# Patient Record
Sex: Female | Born: 1955 | Race: White | Hispanic: No | Marital: Married
Health system: Southern US, Community
[De-identification: ages and names within clinical notes are randomized; demographics above are authoritative.]

## PROBLEM LIST (undated history)

## (undated) DIAGNOSIS — M51369 Other intervertebral disc degeneration, lumbar region without mention of lumbar back pain or lower extremity pain: Secondary | ICD-10-CM

## (undated) DIAGNOSIS — D499 Neoplasm of unspecified behavior of unspecified site: Secondary | ICD-10-CM

## (undated) DIAGNOSIS — E039 Hypothyroidism, unspecified: Secondary | ICD-10-CM

## (undated) DIAGNOSIS — M5136 Other intervertebral disc degeneration, lumbar region: Secondary | ICD-10-CM

## (undated) DIAGNOSIS — M5030 Other cervical disc degeneration, unspecified cervical region: Secondary | ICD-10-CM

## (undated) HISTORY — DX: Hypothyroidism, unspecified: E03.9

## (undated) HISTORY — DX: Neoplasm of unspecified behavior of unspecified site: D49.9

## (undated) HISTORY — PX: THYROIDECTOMY: SHX17

## (undated) HISTORY — PX: OTHER SURGICAL HISTORY: SHX169

## (undated) HISTORY — DX: Other cervical disc degeneration, unspecified cervical region: M50.30

## (undated) HISTORY — PX: DILATION AND CURETTAGE OF UTERUS: SHX78

## (undated) HISTORY — DX: Other intervertebral disc degeneration, lumbar region without mention of lumbar back pain or lower extremity pain: M51.369

## (undated) HISTORY — DX: Other intervertebral disc degeneration, lumbar region: M51.36

---

## 2004-06-25 ENCOUNTER — Inpatient Hospital Stay (HOSPITAL_COMMUNITY): Admission: RE | Admit: 2004-06-25 | Discharge: 2004-06-27 | Payer: Self-pay | Admitting: Neurosurgery

## 2011-02-01 ENCOUNTER — Emergency Department (HOSPITAL_BASED_OUTPATIENT_CLINIC_OR_DEPARTMENT_OTHER)
Admission: EM | Admit: 2011-02-01 | Discharge: 2011-02-02 | Disposition: A | Discharge status: Home / Self Care | Payer: 59 | Attending: Emergency Medicine | Admitting: Emergency Medicine

## 2011-02-01 DIAGNOSIS — K8020 Calculus of gallbladder without cholecystitis without obstruction: Secondary | ICD-10-CM | POA: Insufficient documentation

## 2011-02-01 DIAGNOSIS — R109 Unspecified abdominal pain: Secondary | ICD-10-CM | POA: Insufficient documentation

## 2011-02-02 ENCOUNTER — Emergency Department (HOSPITAL_COMMUNITY): Payer: Self-pay

## 2011-02-02 ENCOUNTER — Other Ambulatory Visit (HOSPITAL_BASED_OUTPATIENT_CLINIC_OR_DEPARTMENT_OTHER): Payer: Self-pay | Admitting: Emergency Medicine

## 2011-02-02 ENCOUNTER — Emergency Department (HOSPITAL_BASED_OUTPATIENT_CLINIC_OR_DEPARTMENT_OTHER): Payer: 59

## 2011-02-02 DIAGNOSIS — R109 Unspecified abdominal pain: Secondary | ICD-10-CM

## 2011-02-02 LAB — CBC
HCT: 40 % (ref 36.0–46.0)
Hemoglobin: 13.3 g/dL (ref 12.0–15.0)
MCH: 28.1 pg (ref 26.0–34.0)
MCHC: 33.3 g/dL (ref 30.0–36.0)
Platelets: 231 10*3/uL (ref 150–400)
RBC: 4.74 MIL/uL (ref 3.87–5.11)
RDW: 13.3 % (ref 11.5–15.5)
WBC: 5.4 10*3/uL (ref 4.0–10.5)

## 2011-02-02 LAB — DIFFERENTIAL
Basophils Absolute: 0 10*3/uL (ref 0.0–0.1)
Basophils Relative: 0 % (ref 0–1)
Eosinophils Absolute: 0.1 10*3/uL (ref 0.0–0.7)
Eosinophils Relative: 1 % (ref 0–5)
Lymphocytes Relative: 18 % (ref 12–46)
Lymphs Abs: 1 10*3/uL (ref 0.7–4.0)
Monocytes Relative: 9 % (ref 3–12)
Neutro Abs: 3.9 10*3/uL (ref 1.7–7.7)
Neutrophils Relative %: 72 % (ref 43–77)

## 2011-02-02 LAB — COMPREHENSIVE METABOLIC PANEL
ALT: 19 U/L (ref 0–35)
Alkaline Phosphatase: 59 U/L (ref 39–117)
BUN: 15 mg/dL (ref 6–23)
Chloride: 103 mEq/L (ref 96–112)
GFR calc Af Amer: >60 mL/min (ref >60)
GFR calc non Af Amer: >60 mL/min (ref >60)
Potassium: 3.6 mEq/L (ref 3.5–5.1)
Sodium: 140 mEq/L (ref 135–145)
Total Bilirubin: 0.6 mg/dL (ref 0.3–1.2)

## 2011-02-02 LAB — URINALYSIS, ROUTINE W REFLEX MICROSCOPIC
Ketones, ur: NEGATIVE mg/dL
Leukocytes, UA: NEGATIVE
Protein, ur: NEGATIVE mg/dL
Specific Gravity, Urine: 1.026 (ref 1.005–1.030)
Urobilinogen, UA: 0.2 mg/dL (ref 0.0–1.0)
pH: 5.5 (ref 5.0–8.0)

## 2011-02-02 LAB — URINE MICROSCOPIC-ADD ON

## 2011-02-03 ENCOUNTER — Emergency Department (HOSPITAL_BASED_OUTPATIENT_CLINIC_OR_DEPARTMENT_OTHER)
Admission: RE | Admit: 2011-02-03 | Discharge: 2011-02-03 | Disposition: A | Discharge status: Home / Self Care | Payer: 59 | Source: Ambulatory Visit | Attending: Emergency Medicine | Admitting: Emergency Medicine

## 2011-02-03 DIAGNOSIS — K7689 Other specified diseases of liver: Secondary | ICD-10-CM | POA: Insufficient documentation

## 2011-02-03 DIAGNOSIS — K8020 Calculus of gallbladder without cholecystitis without obstruction: Secondary | ICD-10-CM | POA: Insufficient documentation

## 2011-02-03 DIAGNOSIS — N2889 Other specified disorders of kidney and ureter: Secondary | ICD-10-CM | POA: Insufficient documentation

## 2011-02-03 DIAGNOSIS — R109 Unspecified abdominal pain: Secondary | ICD-10-CM

## 2011-02-03 DIAGNOSIS — N200 Calculus of kidney: Secondary | ICD-10-CM

## 2011-02-03 DIAGNOSIS — N201 Calculus of ureter: Secondary | ICD-10-CM

## 2011-08-27 ENCOUNTER — Other Ambulatory Visit: Payer: Self-pay | Admitting: Neurosurgery

## 2011-08-27 DIAGNOSIS — M546 Pain in thoracic spine: Secondary | ICD-10-CM

## 2011-08-30 ENCOUNTER — Ambulatory Visit
Admission: RE | Admit: 2011-08-30 | Discharge: 2011-08-30 | Disposition: A | Discharge status: Home / Self Care | Payer: 59 | Source: Ambulatory Visit | Attending: Neurosurgery | Admitting: Neurosurgery

## 2011-08-30 DIAGNOSIS — M546 Pain in thoracic spine: Secondary | ICD-10-CM

## 2011-08-30 MED ORDER — GADOBENATE DIMEGLUMINE 529 MG/ML IV SOLN
20.0000 mL | Freq: Once | INTRAVENOUS | Status: AC | PRN
  Administered 2011-08-30: 20 mL via INTRAVENOUS

## 2011-09-16 ENCOUNTER — Other Ambulatory Visit: Payer: Self-pay | Admitting: Neurosurgery

## 2011-09-16 DIAGNOSIS — R937 Abnormal findings on diagnostic imaging of other parts of musculoskeletal system: Secondary | ICD-10-CM

## 2011-09-19 ENCOUNTER — Ambulatory Visit
Admission: RE | Admit: 2011-09-19 | Discharge: 2011-09-19 | Disposition: A | Discharge status: Home / Self Care | Payer: 59 | Source: Ambulatory Visit | Attending: Neurosurgery | Admitting: Neurosurgery

## 2011-09-19 DIAGNOSIS — R937 Abnormal findings on diagnostic imaging of other parts of musculoskeletal system: Secondary | ICD-10-CM

## 2013-01-09 IMAGING — CT CT ABD-PELV W/O CM
2 of 4 series · 16 of 46 positions shown, 18 images · non-contrast
Comparison: None.

CLINICAL DATA: Left flank pain

CT ABDOMEN AND PELVIS WITHOUT CONTRAST
TECHNIQUE: Multidetector CT imaging of the abdomen and pelvis was
performed following the standard protocol without intravenous
contrast.

[Series 2: abd/pelvis 5.0 b31f · axial · 0.86mm/px · z∈[-460,-10]mm · 13 of 100 slices shown, 15 images]
[im 5/100  soft-tissue]
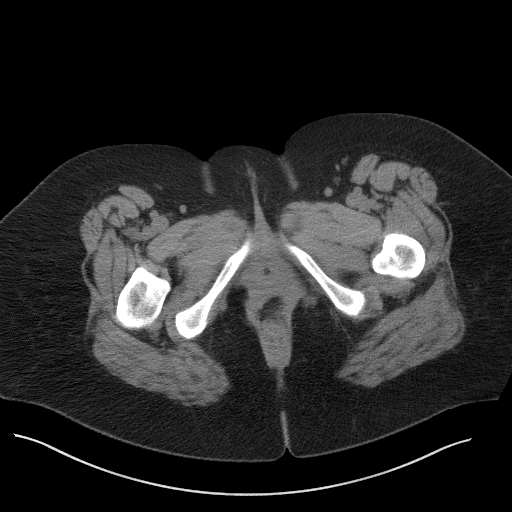
[im 5/100  bone]
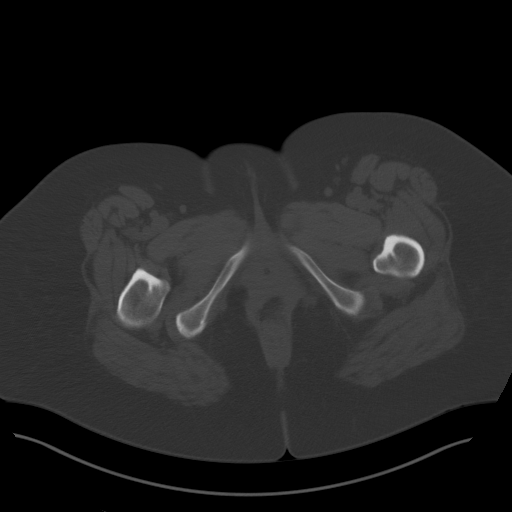
[im 13/100  soft-tissue]
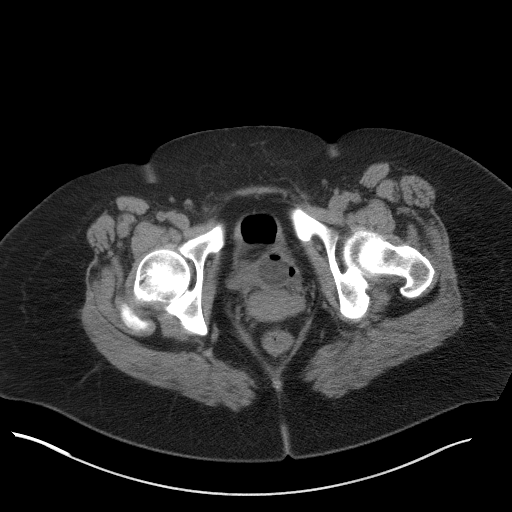
[im 21/100  soft-tissue]
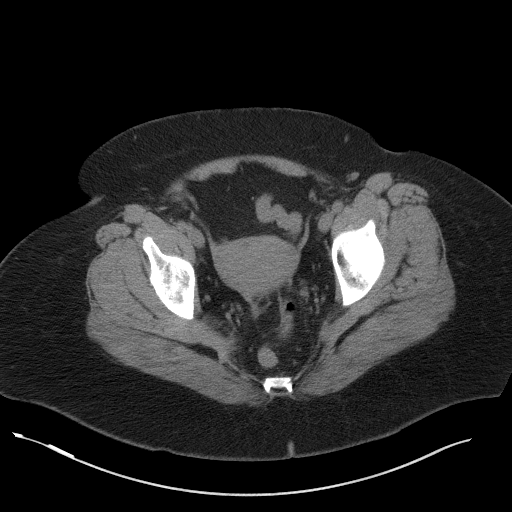
[im 29/100  soft-tissue]
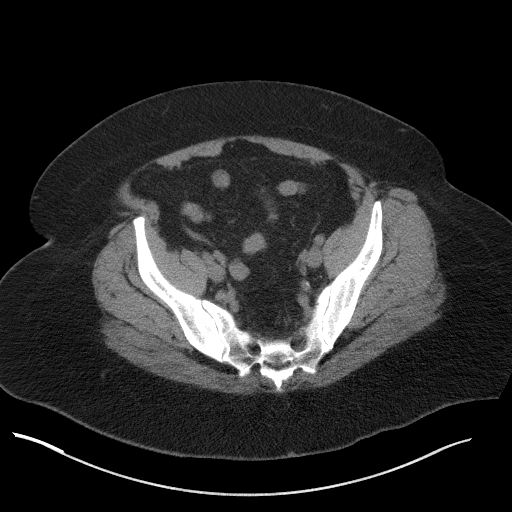
[im 34/100  soft-tissue]
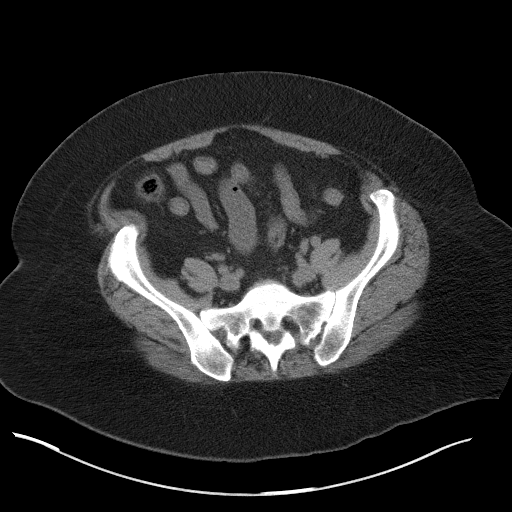
[im 42/100  soft-tissue]
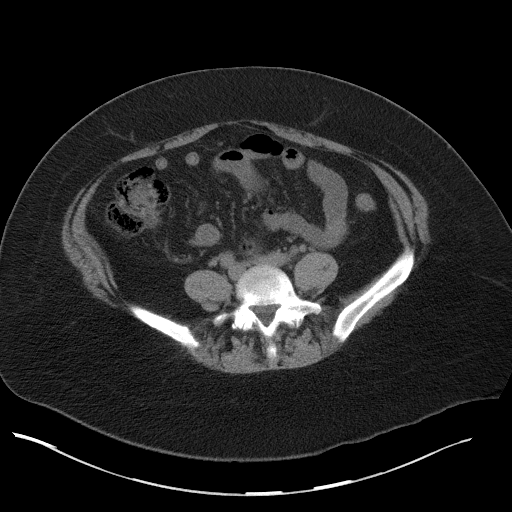
[im 50/100  soft-tissue]
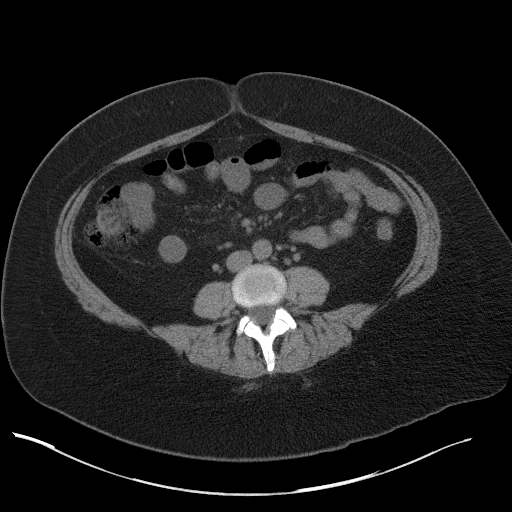
[im 58/100  soft-tissue]
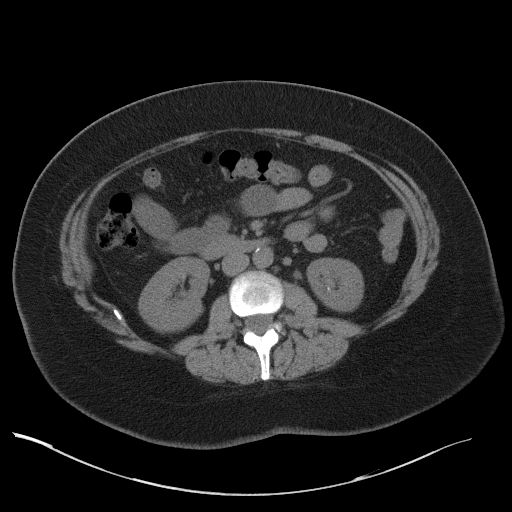
[im 67/100  soft-tissue]
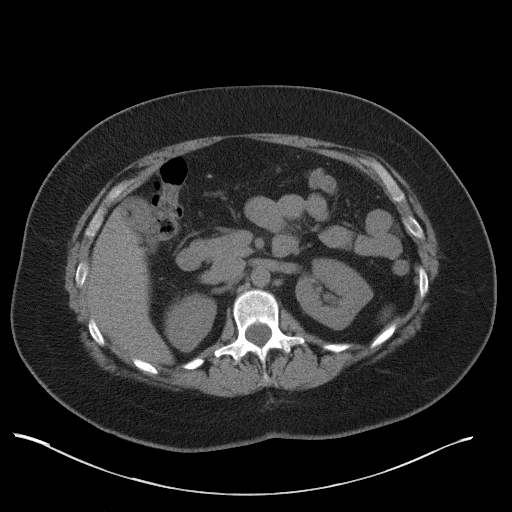
[im 67/100  bone]
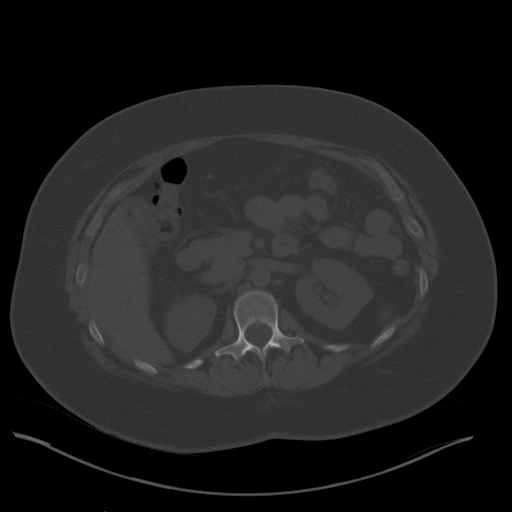
[im 71/100  soft-tissue]
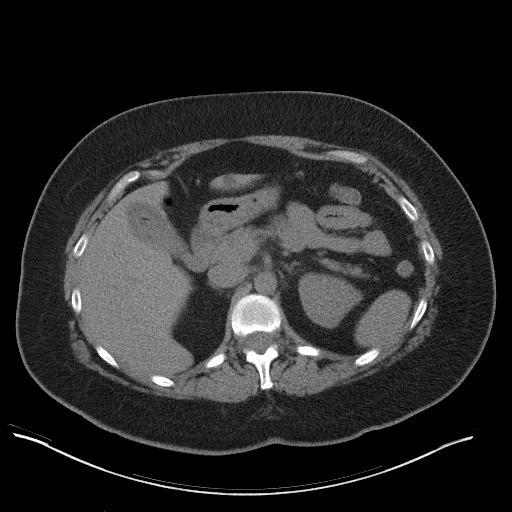
[im 79/100  soft-tissue]
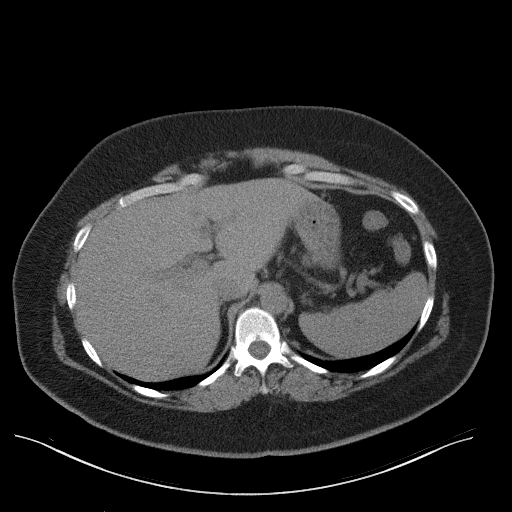
[im 87/100  soft-tissue]
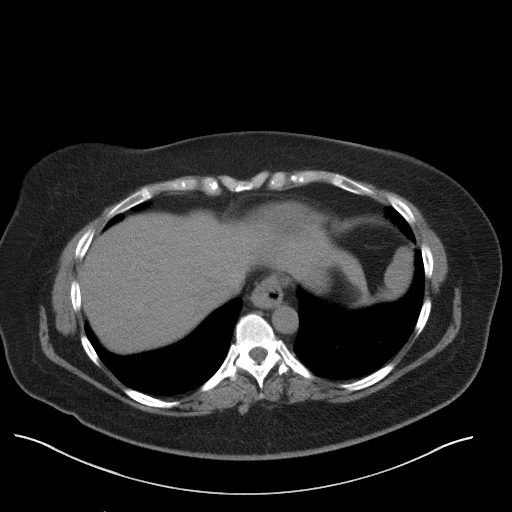
[im 95/100  soft-tissue]
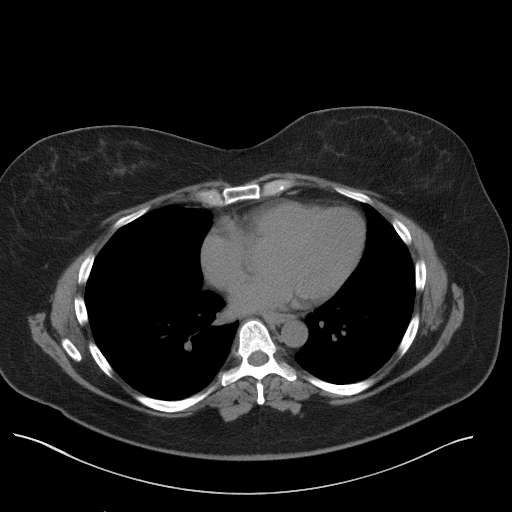

[Series 5: abd/pelvis 3.0 coronal · coronal · 0.87mm/px · 3 of 93 slices shown]
[im 31/93  soft-tissue]
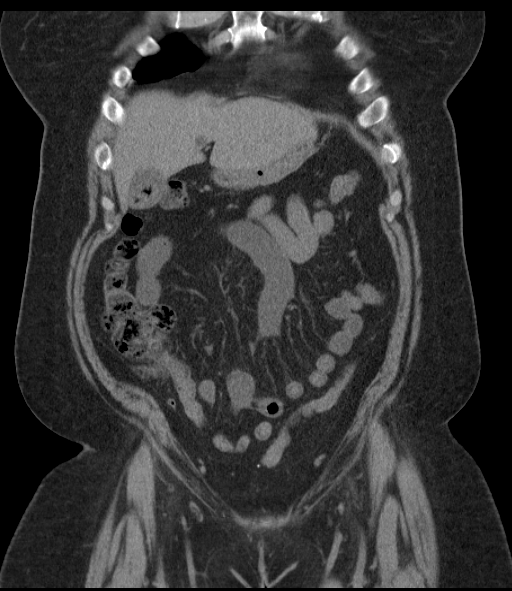
[im 41/93  soft-tissue]
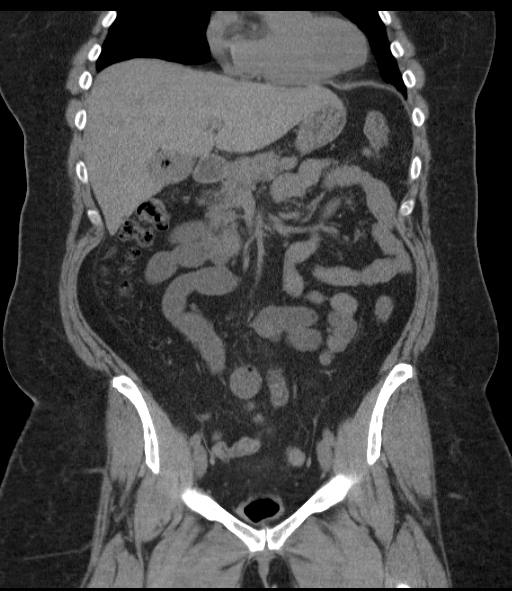
[im 52/93  soft-tissue]
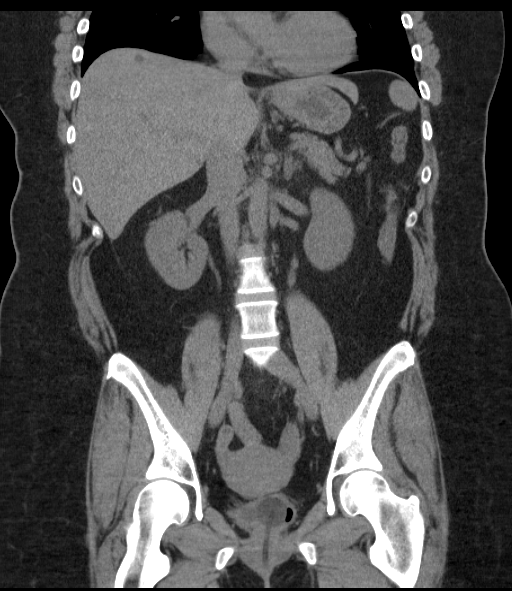

[16 of 46 positions shown; findings below may reference images not displayed]

FINDINGS: There is mild left pyelocaliectasis and ureterectasis
down to the ureterovesicle junction, where there is a tiny 3mm
stone.  There is mild periureteral and pararenal stranding
suggesting mild to moderate obstruction.  There is a small
intrarenal stone in the left kidney measuring less than 2 mm.  The
right renal collecting system is nondilated.  The bladder is
decompressed with Foley catheter and cannot be evaluated.  Small
amount of gas in the bladder, likely from catheterization.

Multiple low attenuation lesions demonstrated in the liver are
nonspecific but probably represent small cysts.  Cholelithiasis
without bile duct dilatation.  The pancreas, spleen, and adrenal
glands are unremarkable.  Small esophageal hiatal hernia.  Normal
caliber large and small bowel.  No abdominal ascites or free air.
Normal caliber abdominal aorta.

Pelvis:  No free or loculated fluid collections.  The uterus and
adnexal structures are not enlarged.  No significant pelvic
lymphadenopathy.  The appendix is normal.  Mild degenerative
changes at the lumbosacral interspace.
IMPRESSION: 3 mm mildly obstructing stone in the left ureterovesicle junction
with mild proximal pyelocaliectasis and ureterectasis.  Small
intrarenal stone on the left.  Low attenuation lesions in the
liver, likely cysts.  Cholelithiasis.

## 2013-08-26 IMAGING — US US SOFT TISSUE HEAD/NECK
1 series · 14 of 25 positions shown · non-contrast
Comparison: Thoracic MRI 08/30/2011.

CLINICAL DATA: 55-year-old female with abnormal thoracic inlet on
MRI.

THYROID ULTRASOUND
TECHNIQUE: Ultrasound examination of the thyroid gland and adjacent
soft tissues was performed.

[Series 1: us soft tissue head/neck · 0.08mm/px · 14 of 46 slices shown]
[im 1/46]
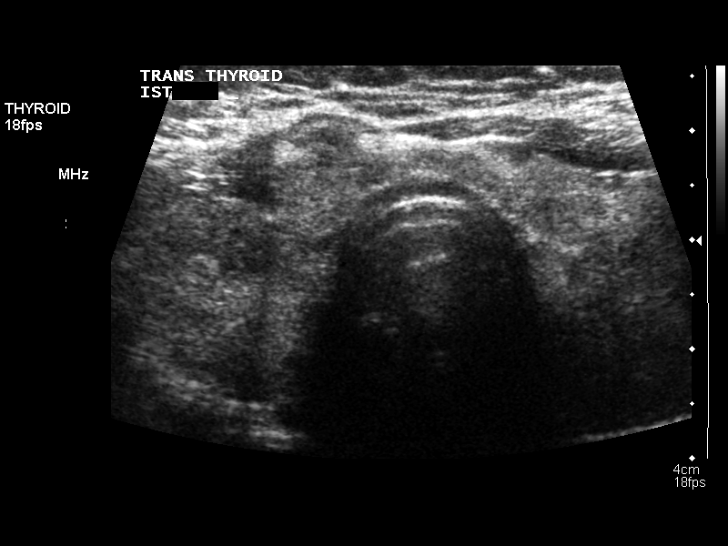
[im 4/46]
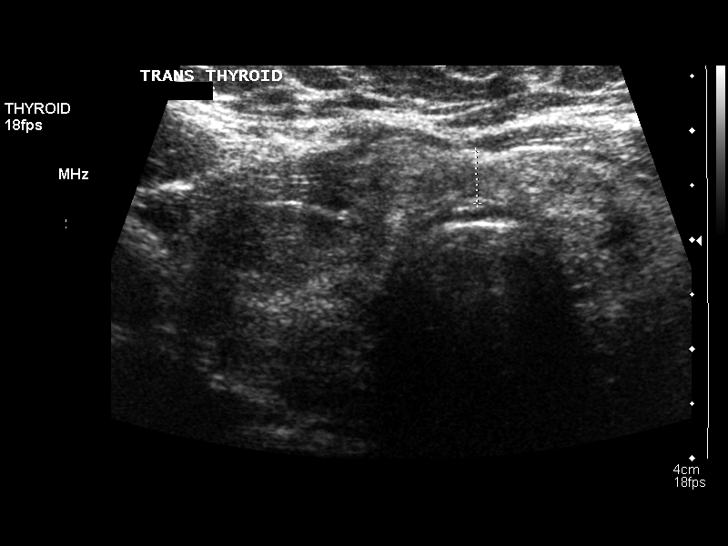
[im 8/46]
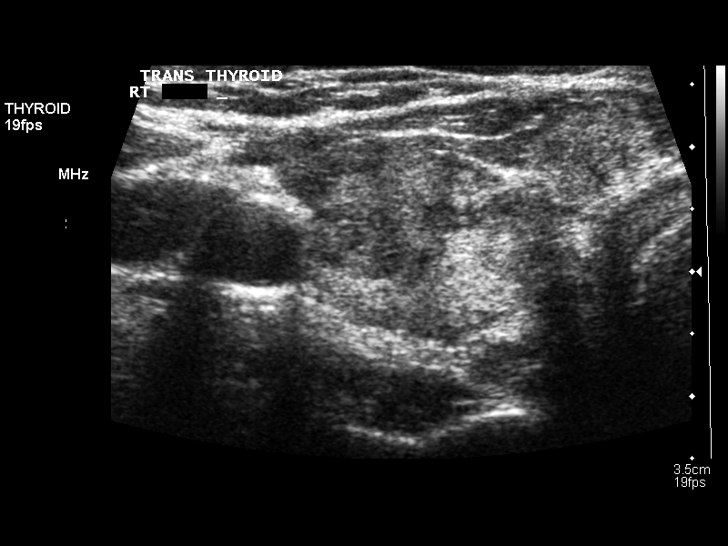
[im 12/46]
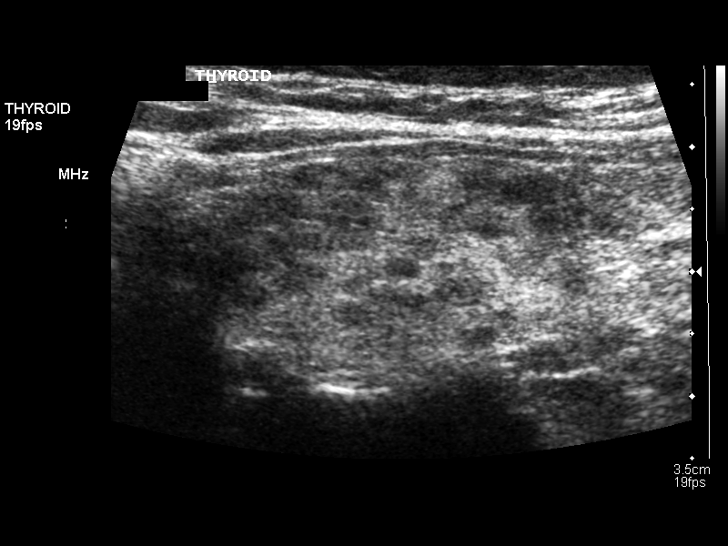
[im 16/46]
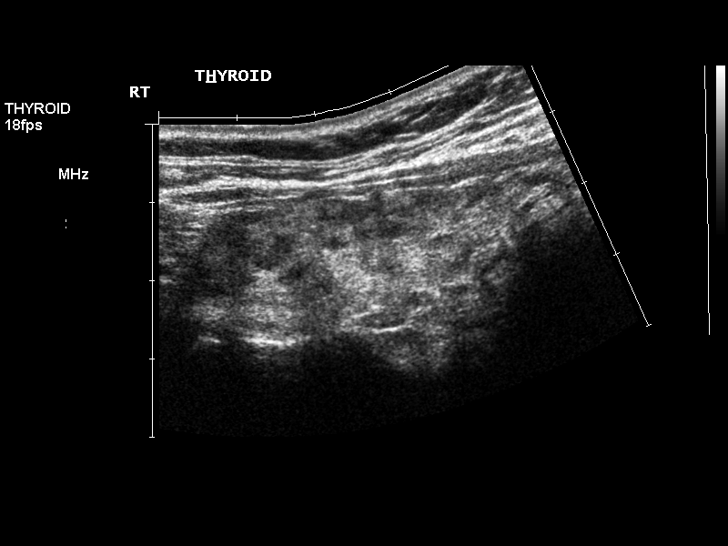
[im 17/46]
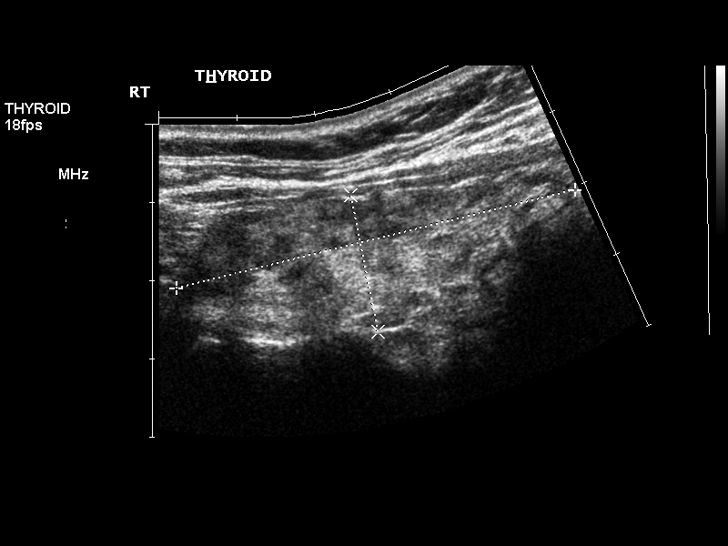
[im 21/46]
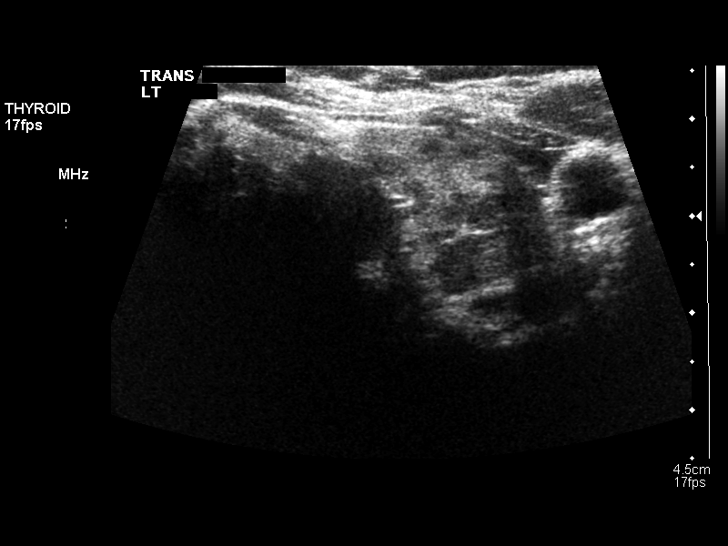
[im 25/46]
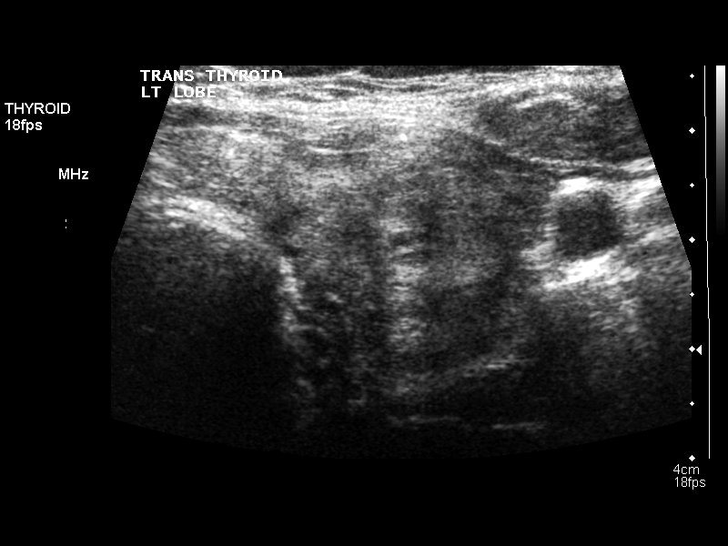
[im 29/46]
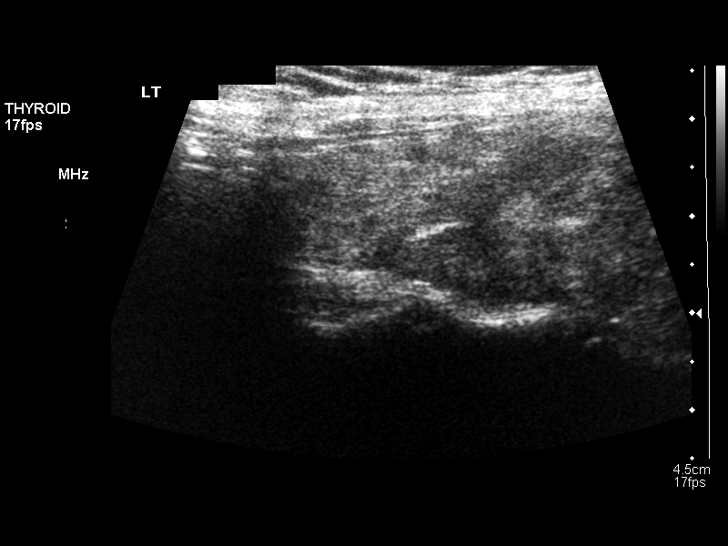
[im 31/46]
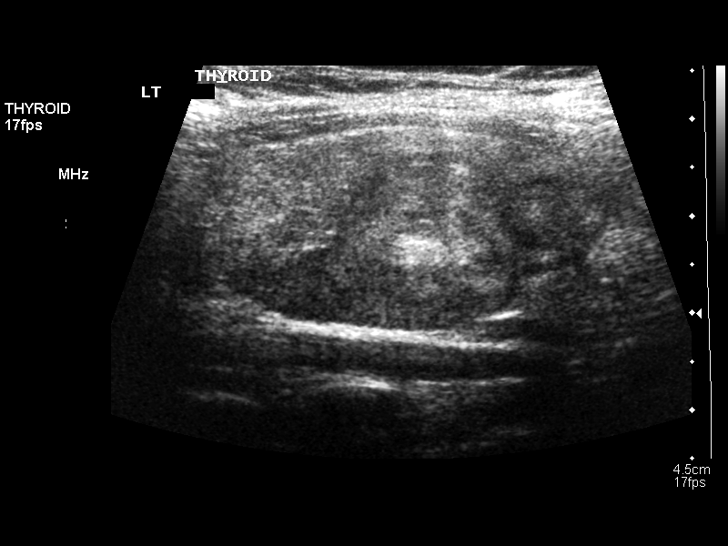
[im 34/46]
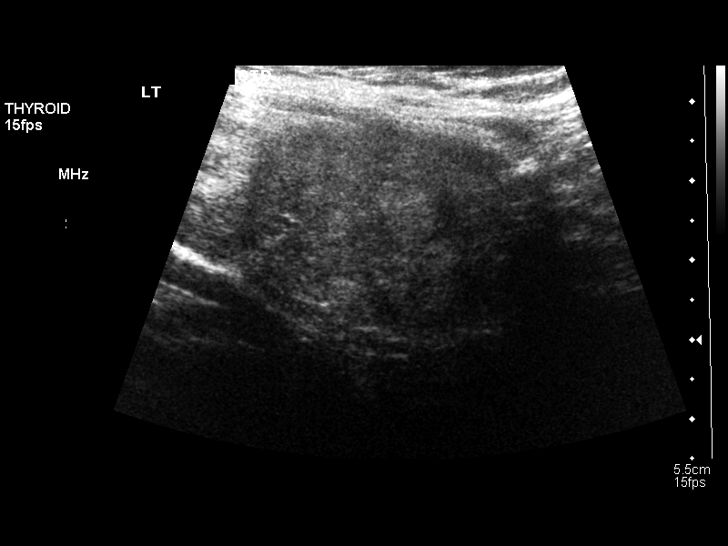
[im 38/46]
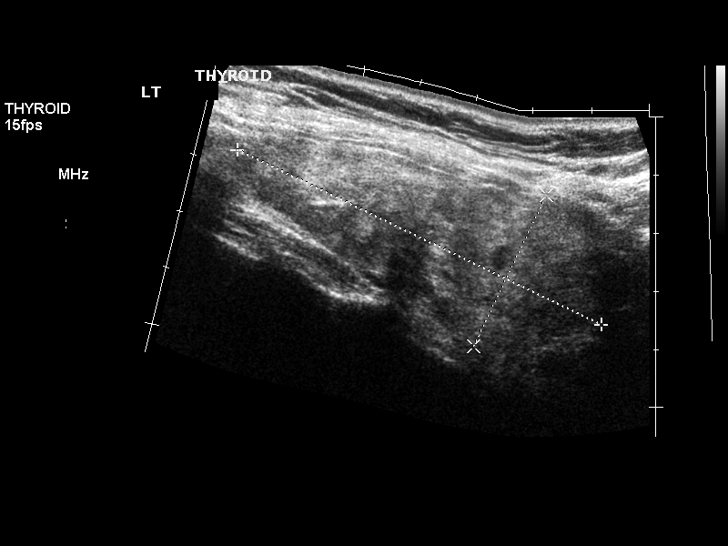
[im 42/46]
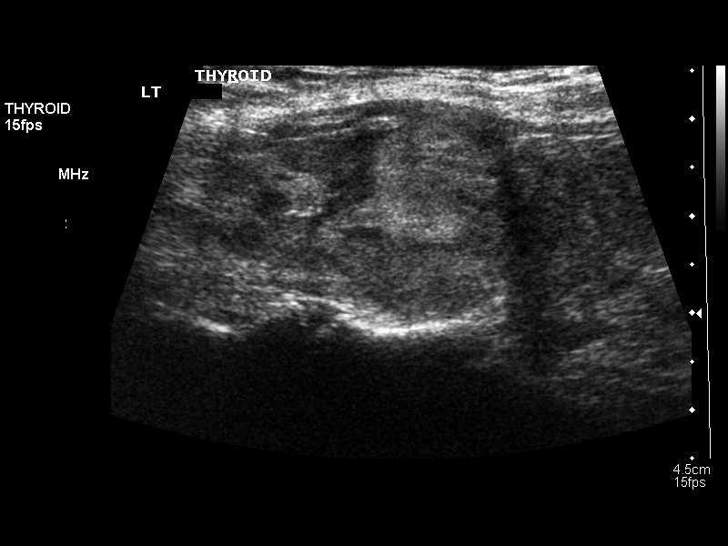
[im 46/46]
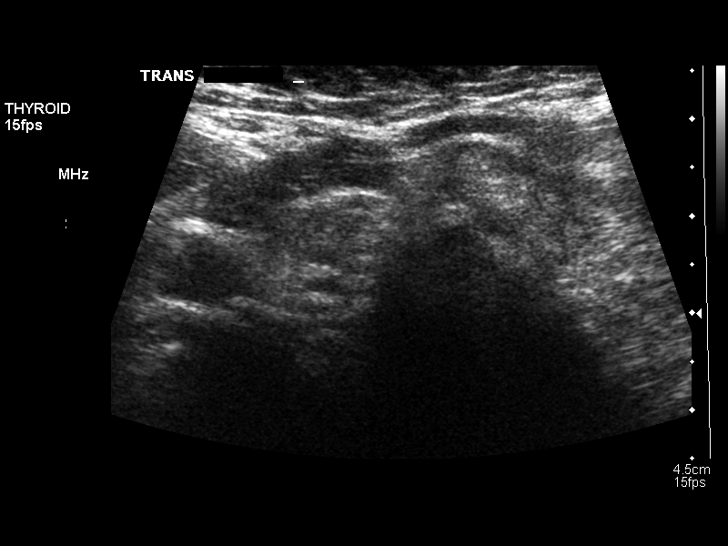

[14 of 25 positions shown; findings below may reference images not displayed]

FINDINGS: Right thyroid lobe:  5.3 x 1.8 x 2.1 cm.
Left thyroid lobe:  7.0 x 2.9 x 3.1 cm.
Isthmus:  5 mm.

Focal nodules:  The thyroid gland is diffusely heterogeneous.

Right lobe:  No discrete nodule.

Left lobe:  Nearly isoechoic nodule versus pseudo-lesion with
central cystic or hypoechoic area measures roughly 3-4 cm.

Lymphadenopathy:  None visualized.
IMPRESSION: Heterogeneous thyroid with left lobe enlargement. Favor benign
goiter and/or sequelae of previous thyroiditis.
Vague, somewhat nodular area in the left lower lobe measures 3-4
cm.
Surveillance thyroid ultrasound in 1 year may be the best option in
this setting.

## 2019-06-11 ENCOUNTER — Other Ambulatory Visit: Payer: Self-pay

## 2019-06-11 DIAGNOSIS — Z20822 Contact with and (suspected) exposure to covid-19: Secondary | ICD-10-CM

## 2019-06-13 LAB — NOVEL CORONAVIRUS, NAA: SARS-CoV-2, NAA: DETECTED — AB

## 2022-10-18 ENCOUNTER — Encounter: Payer: Self-pay | Admitting: Podiatry

## 2022-10-18 ENCOUNTER — Ambulatory Visit (INDEPENDENT_AMBULATORY_CARE_PROVIDER_SITE_OTHER): Payer: 59 | Admitting: Podiatry

## 2022-10-18 DIAGNOSIS — M722 Plantar fascial fibromatosis: Secondary | ICD-10-CM | POA: Diagnosis not present

## 2022-10-18 MED ORDER — DEXAMETHASONE SODIUM PHOSPHATE 120 MG/30ML IJ SOLN
4.0000 mg | Freq: Once | INTRAMUSCULAR | Status: AC
  Administered 2022-10-18: 4 mg via INTRA_ARTICULAR

## 2022-10-18 NOTE — Patient Instructions (Signed)

## 2022-10-18 NOTE — Progress Notes (Signed)
  Subjective:  Patient ID: Kristine Richmond, female    DOB: December 03, 1955,   MRN: [id number]  Chief Complaint  Patient presents with   Foot Pain    left foot has damaged a nerve and having hard time walking without pain    67 y.o. female presents for concern of left foot pain that has been going on for about 4 years. She was told she had damaged a nerve in her foot. Relates she was wearing pad shoes for 7 hours one day and since then has had some trouble with this foot. Relates tingling and burning pain in her feet but this pain is different. . She has tried stretching as she feels it needs to be stretched. She does have issues with her lower back and her left side. She has been seen by neurology but told the pain was due to her weight. Denies any other pedal complaints. Denies n/v/f/c.   History reviewed. No pertinent past medical history.  Objective:  Physical Exam: Vascular: DP/PT pulses 2/4 bilateral. CFT <3 seconds. Normal hair growth on digits. No edema.  Skin. No lacerations or abrasions bilateral feet.  Musculoskeletal: MMT 5/5 bilateral lower extremities in DF, PF, Inversion and Eversion. Deceased ROM in DF of ankle joint. Firm soft tissue mass about 2 cm noted to medial plantar fascia. Mobile with fascia and tender to palpation. No pain to medial calcaneal tubercle.  Neurological: Sensation intact to light touch.   Assessment:   1. Plantar fascial fibromatosis      Plan:  Patient was evaluated and treated and all questions answered. Discussed plantar fibroma with patient.  X-rays deferred today.  Reviewed previous doctors noted.  Discussed treatment options including, ice, NSAIDS, supportive shoes, bracing, and stretching. Stretching exercises provided to be done on a daily basis.   Relates meloxicam not helpful in past.  Verpamil cream prescribed.  Padding provided to offload.  Patient requesting injection today. Procedure note below.   Follow-up 6 weeks or sooner if any  problems arise. In the meantime, encouraged to call the office with any questions, concerns, change in symptoms.   Procedure:  Discussed etiology, pathology, conservative vs. surgical therapies. At this time a plantar fascial injection was recommended.  The patient agreed and a sterile skin prep was applied.  An injection consisting of  dexamethasone and marcaine mixture was infiltrated at the point of maximal tenderness on the left  plantar fibroma.  Bandaid applied. The patient tolerated this well and was given instructions for aftercare.     Lorenda Peck, DPM

## 2022-11-29 ENCOUNTER — Ambulatory Visit (INDEPENDENT_AMBULATORY_CARE_PROVIDER_SITE_OTHER): Payer: 59 | Admitting: Podiatry

## 2022-11-29 ENCOUNTER — Encounter: Payer: Self-pay | Admitting: Podiatry

## 2022-11-29 DIAGNOSIS — M722 Plantar fascial fibromatosis: Secondary | ICD-10-CM | POA: Diagnosis not present

## 2022-11-29 MED ORDER — DEXAMETHASONE SODIUM PHOSPHATE 120 MG/30ML IJ SOLN
4.0000 mg | Freq: Once | INTRAMUSCULAR | Status: AC
  Administered 2022-11-29: 4 mg via INTRA_ARTICULAR

## 2022-11-29 NOTE — Progress Notes (Signed)
  Subjective:  Patient ID: Kristine Richmond, female    DOB: 24-Oct-1955,   MRN: [id number]  No chief complaint on file.   67 y.o. female presents for follow-up of left foot plantar fibroma that has been going on for about 4 years.  Relates she has been wearing braces and that has been helping. She is still getting pain but varies. Has been using night splints that helps and stretching. Has been using cream.  Denies any other pedal complaints. Denies n/v/f/c.   No past medical history on file.  Objective:  Physical Exam: Vascular: DP/PT pulses 2/4 bilateral. CFT <3 seconds. Normal hair growth on digits. No edema.  Skin. No lacerations or abrasions bilateral feet.  Musculoskeletal: MMT 5/5 bilateral lower extremities in DF, PF, Inversion and Eversion. Deceased ROM in DF of ankle joint. Firm soft tissue mass about 2 cm noted to medial plantar fascia. Mobile with fascia and tender to palpation. No pain to medial calcaneal tubercle.  Neurological: Sensation intact to light touch.   Assessment:   1. Plantar fascial fibromatosis       Plan:  Patient was evaluated and treated and all questions answered. Discussed plantar fibroma with patient.  X-rays deferred today.  Reviewed previous doctors noted.  Discussed treatment options including, ice, NSAIDS, supportive shoes, bracing, and stretching. Stretching exercises provided to be done on a daily basis.   Relates meloxicam not helpful in past.  Continue verapamil and night splint.  Patient requesting injection today. Procedure note below.   Follow-up as needed.  Will be going on trip to [place].   Procedure:  Discussed etiology, pathology, conservative vs. surgical therapies. At this time a plantar fascial injection was recommended.  The patient agreed and a sterile skin prep was applied.  An injection consisting of  dexamethasone and marcaine mixture was infiltrated at the point of maximal tenderness on the left  plantar fibroma.  Bandaid  applied. The patient tolerated this well and was given instructions for aftercare.     Lorenda Peck, DPM

## 2023-01-30 ENCOUNTER — Encounter: Payer: Self-pay | Admitting: Podiatry

## 2023-01-30 ENCOUNTER — Ambulatory Visit (INDEPENDENT_AMBULATORY_CARE_PROVIDER_SITE_OTHER): Payer: 59 | Admitting: Podiatry

## 2023-01-30 DIAGNOSIS — M722 Plantar fascial fibromatosis: Secondary | ICD-10-CM | POA: Diagnosis not present

## 2023-01-30 NOTE — Progress Notes (Signed)
  Subjective:  Patient ID: Kristine Richmond, female    DOB: December 21, 1955,   MRN: [id number]  Chief Complaint  Patient presents with   Foot Pain    Patient came in today for left foot heel pain, rate of pain 8 out of 10, patient states that the pain never goes away, only icing has helped     67 y.o. female presents for follow-up of left foot plantar fibroma. Relates the fibroma has improved however she has had more pain in the heel area and relates struggled during her trip. Relates the only thing that has helped in the heel area is icing.   Denies any other pedal complaints. Denies n/v/f/c.   History reviewed. No pertinent past medical history.  Objective:  Physical Exam: Vascular: DP/PT pulses 2/4 bilateral. CFT <3 seconds. Normal hair growth on digits. No edema.  Skin. No lacerations or abrasions bilateral feet.  Musculoskeletal: MMT 5/5 bilateral lower extremities in DF, PF, Inversion and Eversion. Deceased ROM in DF of ankle joint. Firm soft tissue mass in plantar fascia has improved and no longer palpable. Tender today to the medial calcaneal tubercle. No pain in arch. No pain along PT or achilles tendon. No pain with calcaneal squeeze.  Neurological: Sensation intact to light touch.   Assessment:   1. Plantar fasciitis, left        Plan:  Patient was evaluated and treated and all questions answered. Discussed plantar fibroma with patient.  X-rays deferred today.  Reviewed previous doctors noted.  Discussed treatment options including, ice, NSAIDS, supportive shoes, bracing, and stretching. Continue stretching.  Continue night splint.  PF brace dispensed.  Patient requesting injection today. Procedure note below.   Follow-up in 6 weeks for recheck.   Procedure:  Discussed etiology, pathology, conservative vs. surgical therapies. At this time a plantar fascial injection was recommended.  The patient agreed and a sterile skin prep was applied.  An injection consisting of   dexamethasone and marcaine mixture was infiltrated at the point of maximal tenderness on the left  plantar fibroma.  Bandaid applied. The patient tolerated this well and was given instructions for aftercare.     Louann Sjogren, DPM

## 2023-03-17 ENCOUNTER — Encounter (INDEPENDENT_AMBULATORY_CARE_PROVIDER_SITE_OTHER): Payer: 59 | Admitting: Podiatry

## 2023-03-17 DIAGNOSIS — Z91199 Patient's noncompliance with other medical treatment and regimen due to unspecified reason: Secondary | ICD-10-CM

## 2023-03-19 NOTE — Progress Notes (Signed)
This encounter was created in error - please disregard.  Patient was a no show for scheduled appt today. 

## 2023-04-14 ENCOUNTER — Telehealth: Payer: Self-pay | Admitting: Podiatry

## 2023-04-14 NOTE — Telephone Encounter (Signed)
Thanks, we can discuss MRI tomorrow but the timing varies and usually takes a couple weeks to get in at least. Thanks

## 2023-04-14 NOTE — Telephone Encounter (Signed)
Pt scheduled to see you tomorrow in [place] office and wanted to know about getting a mri as the pain is no better since she started coming here. She wanted to know how long it would take to get the mri. She will discuss further at the appt tomorrow.

## 2023-04-15 ENCOUNTER — Ambulatory Visit (INDEPENDENT_AMBULATORY_CARE_PROVIDER_SITE_OTHER): Payer: 59 | Admitting: Podiatry

## 2023-04-15 ENCOUNTER — Encounter: Payer: Self-pay | Admitting: Podiatry

## 2023-04-15 DIAGNOSIS — M722 Plantar fascial fibromatosis: Secondary | ICD-10-CM

## 2023-04-15 MED ORDER — TRIAMCINOLONE ACETONIDE 10 MG/ML IJ SUSP
10.0000 mg | Freq: Once | INTRAMUSCULAR | Status: AC
Start: 2023-04-15 — End: 2023-04-15
  Administered 2023-04-15: 10 mg

## 2023-04-15 MED ORDER — DEXAMETHASONE SODIUM PHOSPHATE 120 MG/30ML IJ SOLN
4.0000 mg | Freq: Once | INTRAMUSCULAR | Status: AC
Start: 2023-04-15 — End: 2023-04-15
  Administered 2023-04-15: 4 mg via INTRA_ARTICULAR

## 2023-04-15 NOTE — Progress Notes (Signed)
  Subjective:  Patient ID: Kristine Richmond, female    DOB: 09-08-1956,   MRN: [id number]  No chief complaint on file.   67 y.o. female presents for follow-up of left foot plantar fasciitis and firboma. Relates the pain is still present and significant. Denies any improvement. Relates injeciton didn't really help for long. States she is stretching and wearing supportive shoes.    Denies any other pedal complaints. Denies n/v/f/c.   No past medical history on file.  Objective:  Physical Exam: Vascular: DP/PT pulses 2/4 bilateral. CFT <3 seconds. Normal hair growth on digits. No edema.  Skin. No lacerations or abrasions bilateral feet.  Musculoskeletal: MMT 5/5 bilateral lower extremities in DF, PF, Inversion and Eversion. Deceased ROM in DF of ankle joint. Firm soft tissue mass in plantar fascia has improved and no longer palpable. Tender today to the medial calcaneal tubercle. No pain in arch. No pain along PT or achilles tendon. No pain with calcaneal squeeze.  Neurological: Sensation intact to light touch.   Assessment:   1. Plantar fasciitis, left   2. Plantar fascial fibromatosis         Plan:  Patient was evaluated and treated and all questions answered. Discussed plantar fibroma with patient.  X-rays deferred today.  Reviewed previous doctors noted.  Discussed treatment options including, ice, NSAIDS, supportive shoes, bracing, and stretching. Continue stretching.  Continue night splint.  Amb ref to PT.  Will try CAM boot to offload for a period of time to see if this calms down pain.  Patient requesting injection today. Procedure note below.   Follow-up in 6 weeks for recheck.   Procedure:  Discussed etiology, pathology, conservative vs. surgical therapies. At this time a plantar fascial injection was recommended.  The patient agreed and a sterile skin prep was applied.  An injection consisting of  dexamethasone and marcaine mixture was infiltrated at the point of maximal  tenderness on the left  plantar fibroma.  Bandaid applied. The patient tolerated this well and was given instructions for aftercare.     Louann Sjogren, DPM

## 2023-05-04 NOTE — Therapy (Signed)
OUTPATIENT PHYSICAL THERAPY LOWER EXTREMITY EVALUATION   Patient Name: Kristine Richmond MRN: [id number] DOB:[birth year], 67 y.o., female Today's Date: 05/05/2023  END OF SESSION:  PT End of Session - 05/05/23 0848     Visit Number 1    Date for PT Re-Evaluation 06/30/23    Authorization Type AET[employer]    PT Start Time 0848    PT Stop Time 0935    PT Time Calculation (min) 47 min    Activity Tolerance Patient tolerated treatment well    Behavior During Therapy [facility] for tasks assessed/performed             History reviewed. No pertinent past medical history. History reviewed. No pertinent surgical history. There are no problems to display for this patient.   PCP: Pcp, No   REFERRING PROVIDER: Louann Sjogren, DPM   REFERRING DIAG: M72.2 (ICD-10-CM) - Plantar fasciitis, left   THERAPY DIAG:  Pain in left foot  Cramp and spasm  Other abnormalities of gait and mobility  Rationale for Evaluation and Treatment: Rehabilitation  ONSET DATE: 7 months ago  SUBJECTIVE:   SUBJECTIVE STATEMENT: Last cortisone shot did help some. It's the best it's been. Had pf 10 years ago. Tried splints this time but had a lot of leg cramps so would kick them off. Back surgery was left side. No feeling in 4th toe from that. 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 She does the stretches but they don't help. Pain in heel right now. Now I don't walk right. Arch supports don't help. Left leg feels week and sometimes shaking.   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 2019 PAIN:  Are you having pain? Yes: NPRS scale: 7 up to 10/10 Pain location: left heel Pain description: stabbing Aggravating factors: walking, prolonged standing Relieving factors: ice or getting off of it, massager  PRECAUTIONS: None  RED FLAGS: None   WEIGHT BEARING RESTRICTIONS: No  FALLS:  Has patient fallen in last 6 months? No  LIVING  ENVIRONMENT: Lives with: lives with their family Lives in: House/apartment Stairs: Yes: Internal: 13 steps; on right going up Has following equipment at home: None  OCCUPATION: travels for work  PLOF: Independent  PATIENT GOALS: walk normal and exercise again   NEXT MD VISIT: none scheduled  OBJECTIVE:   DIAGNOSTIC FINDINGS: none  PATIENT SURVEYS:  LEFS 48 / 80 = 60.0 %  COGNITION: Overall cognitive status: Within functional limits for tasks assessed     SENSATION: Tingling in Left LE from mid thigh down   MUSCLE LENGTH: Marked HS Bil, left soleus  POSTURE:  weight shift forward  PALPATION: Deep palpation to L mid calcaneus insertion of fascia  LUMBAR ROM: WFL    LOWER EXTREMITY ROM:   ROM Right eval Left eval  Ankle dorsiflexion  2/8  Ankle plantarflexion  68  Ankle inversion  26  Ankle eversion  40   (Blank rows = not tested)  Great toe ext: WNL  LOWER EXTREMITY MMT:  MMT Right eval Left eval  Hip flexion 5 5  Hip extension 5 4+  Hip abduction  5  Hip adduction  5  Hip internal rotation    Hip external rotation    Knee flexion 5 5  Knee extension 5 4+ shaky  Ankle dorsiflexion 5 5  Ankle plantarflexion    Ankle inversion 5 4+  Ankle eversion 5 5   (Blank rows = not tested)   GAIT:  Distance walked: 40 Assistive device utilized: None Level of assistance: Complete Independence Comments: ambulates with no heel strike left decreased knee flexion, decreased stance time left   TODAY'S TREATMENT:                                                                                                                              DATE:   05/05/23 See pt ed and HEP   PATIENT EDUCATION:  Education details: PT eval findings, anticipated POC, and HEP update  Person educated: Patient Education method: Explanation, Demonstration, and Handouts Education comprehension: verbalized understanding and returned demonstration  HOME EXERCISE PROGRAM: Access  Code: [email or web address] URL: [email or web address] Date: 05/05/2023 Prepared by: Raynelle Fanning  Exercises - Single Leg Heel Raise  - 1-2 x daily - 7 x weekly - 3 sets - 10 reps - 3 sec hold - Soleus Stretch on Wall  - 2 x daily - 7 x weekly - 1 sets - 3 reps - 30-60 sec hold  ASSESSMENT:  CLINICAL IMPRESSION: Patient is a 67 y.o. female who was seen today for physical therapy evaluation and treatment for left plantar faciitis. She has tightness in her L soleus, gait abnormalities and pain limiting her gait and mobilty. will benefit from skilled PT to address these deficits.    OBJECTIVE IMPAIRMENTS: Abnormal gait, decreased ROM, decreased strength, increased fascial restrictions, increased muscle spasms, impaired flexibility, postural dysfunction, and pain.   ACTIVITY LIMITATIONS: standing and locomotion level  PARTICIPATION LIMITATIONS: occupation  PERSONAL FACTORS: Time since onset of injury/illness/exacerbation are also affecting patient's functional outcome.   REHAB POTENTIAL: Good  CLINICAL DECISION MAKING: Stable/uncomplicated  EVALUATION COMPLEXITY: Low   GOALS: Goals reviewed with patient? Yes  SHORT TERM GOALS: Target date: 06/02/2023  Patient will be independent with initial HEP. Baseline:  Goal status: INITIAL  2.  Decreased pain in L heel by 30% with gait Baseline:  Goal status: INITIAL    LONG TERM GOALS: Target date: 06/30/2023   Patient will be independent with advanced/ongoing HEP to improve outcomes and carryover.  Baseline:  Goal status: INITIAL  2.  Patient will report at least 75% improvement in L foot pain to improve QOL. Baseline:  Goal status: INITIAL  3.  Patient will demonstrate improved L ankle active DF to  5 deg  to allow for normal gait and stair mechanics. Baseline:  Goal status: INITIAL  4.  Patient will be able to ambulate 600' a normal gait pattern without increased foot pain to access community.  Baseline:  Goal status:  INITIAL  5. Patient will report 57/80 on LEFS  to demonstrate improved functional ability. Baseline: 48 / 80 = 60.0 % Goal status: INITIAL      PLAN:  PT FREQUENCY: 1-2x/week  PT DURATION: 8 weeks  PLANNED INTERVENTIONS: Therapeutic exercises, Therapeutic activity, Neuromuscular re-education, Balance training, Gait training, Patient/Family education, Self Care, Joint mobilization, Stair training, Dry Needling, Electrical stimulation, Spinal mobilization, Cryotherapy, Moist heat, Taping, Ultrasound, Ionotophoresis 4mg /ml  Dexamethasone, and Manual therapy  PLAN FOR NEXT SESSION:MT/DN to quadratus plantae and gastroc/solues; review tempo heel lifts, [name] and/or ionto, gait training   Solon Palm, PT  05/05/2023, 1:02 PM

## 2023-05-05 ENCOUNTER — Ambulatory Visit: Payer: 59 | Attending: Podiatry | Admitting: Physical Therapy

## 2023-05-05 ENCOUNTER — Encounter: Payer: Self-pay | Admitting: Physical Therapy

## 2023-05-05 ENCOUNTER — Other Ambulatory Visit: Payer: Self-pay

## 2023-05-05 DIAGNOSIS — M722 Plantar fascial fibromatosis: Secondary | ICD-10-CM | POA: Insufficient documentation

## 2023-05-05 DIAGNOSIS — M79672 Pain in left foot: Secondary | ICD-10-CM | POA: Diagnosis present

## 2023-05-05 DIAGNOSIS — R252 Cramp and spasm: Secondary | ICD-10-CM | POA: Insufficient documentation

## 2023-05-05 DIAGNOSIS — R2689 Other abnormalities of gait and mobility: Secondary | ICD-10-CM | POA: Insufficient documentation

## 2023-05-11 NOTE — Therapy (Signed)
OUTPATIENT PHYSICAL THERAPY LOWER EXTREMITY TREATMENT   Patient Name: Kristine Richmond MRN: [id number] DOB:March 03, 1956, 67 y.o., female Today's Date: 05/12/2023  END OF SESSION:  PT End of Session - 05/12/23 0843     Visit Number 2    Date for PT Re-Evaluation 06/30/23    Authorization Type AETNA    PT Start Time 0800    PT Stop Time 0843    PT Time Calculation (min) 43 min    Activity Tolerance Patient tolerated treatment well    Behavior During Therapy [facility] for tasks assessed/performed              History reviewed. No pertinent past medical history. History reviewed. No pertinent surgical history. There are no problems to display for this patient.   PCP: Pcp, No   REFERRING PROVIDER: Louann Sjogren, DPM   REFERRING DIAG: M72.2 (ICD-10-CM) - Plantar fasciitis, left   THERAPY DIAG:  Pain in left foot  Cramp and spasm  Other abnormalities of gait and mobility  Rationale for Evaluation and Treatment: Rehabilitation  ONSET DATE: 7 months ago  SUBJECTIVE:   SUBJECTIVE STATEMENT: Walked around on Saturday taking engagement pictures with her son and Sunday she could not even get out of bed Sunday.    Eval: Last cortisone shot did help some. It's the best it's been. Had pf 10 years ago. Tried splints this time but had a lot of leg cramps so would kick them off. Back surgery was left side. No feeling in 4th toe from that. 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 She does the stretches but they don't help. Pain in heel right now. Now I don't walk right. Arch supports don't help. Left leg feels week and sometimes shaking.   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 2019 PAIN:  Are you having pain? Yes: NPRS scale: 5 up to 10/10 Pain location: left heel Pain description: stabbing Aggravating factors: walking, prolonged standing Relieving factors: ice or getting off of it,  massager  PRECAUTIONS: None  RED FLAGS: None   WEIGHT BEARING RESTRICTIONS: No  FALLS:  Has patient fallen in last 6 months? No  LIVING ENVIRONMENT: Lives with: lives with their family Lives in: House/apartment Stairs: Yes: Internal: 13 steps; on right going up Has following equipment at home: None  OCCUPATION: travels for work  PLOF: Independent  PATIENT GOALS: walk normal and exercise again   NEXT MD VISIT: none scheduled  OBJECTIVE:   DIAGNOSTIC FINDINGS: none  PATIENT SURVEYS:  LEFS 48 / 80 = 60.0 %  COGNITION: Overall cognitive status: Within functional limits for tasks assessed     SENSATION: Tingling in Left LE from mid thigh down   MUSCLE LENGTH: Marked HS Bil, left soleus  POSTURE:  weight shift forward  PALPATION: Deep palpation to L mid calcaneus insertion of fascia  LUMBAR ROM: WFL    LOWER EXTREMITY ROM:   ROM Right eval Left eval  Ankle dorsiflexion  2/8  Ankle plantarflexion  68  Ankle inversion  26  Ankle eversion  40   (Blank rows = not tested)  Great toe ext: WNL  LOWER EXTREMITY MMT:  MMT Right eval Left eval  Hip flexion 5 5  Hip extension 5 4+  Hip abduction  5  Hip adduction  5  Hip internal rotation    Hip external rotation    Knee flexion 5 5  Knee extension 5 4+ shaky  Ankle dorsiflexion 5  5  Ankle plantarflexion    Ankle inversion 5 4+  Ankle eversion 5 5   (Blank rows = not tested)   GAIT: Distance walked: 40 Assistive device utilized: None Level of assistance: Complete Independence Comments: ambulates with no heel strike left decreased knee flexion, decreased stance time left   TODAY'S TREATMENT:                                                                                                                              DATE:    05/12/23 Bike L 2 x 5.5 min Bil 3 sec tempo heel lifts x 10. Then L only x 10 (no pain) 1.5 min rest, L x 10 Seated fig 4 stretch bil x 20 sec Manual: Skilled  palpation and monitoring of soft tissues during DN, STM to L plantar fascia and L gastroc/soleus Trigger Point Dry-Needling  Treatment instructions: Expect mild to moderate muscle soreness. S/S of pneumothorax if dry needled over a lung field, and to seek immediate medical attention should they occur. Patient verbalized understanding of these instructions and education. Patient Consent Given: Yes Education handout provided: Previously provided Muscles treated: L quadratus plantae, lateral gastroc Electrical stimulation performed: No Parameters: N/A Treatment response/outcome: Twitch Response Elicited and Palpable Increase in Muscle Length    05/05/23 See pt ed and HEP   PATIENT EDUCATION:  Education details: PT eval findings, anticipated POC, and HEP update  Person educated: Patient Education method: Explanation, Demonstration, and Handouts Education comprehension: verbalized understanding and returned demonstration  HOME EXERCISE PROGRAM: Access Code: [email or web address] URL: [email or web address] Date: 05/12/2023 Prepared by: Raynelle Fanning  Exercises - Single Leg Heel Raise  - 1-2 x daily - 7 x weekly - 3 sets - 10 reps - 3 sec hold - Soleus Stretch on Wall  - 2 x daily - 7 x weekly - 1 sets - 3 reps - 30-60 sec hold - Seated Piriformis Stretch with Trunk Bend  - 2 x daily - 7 x weekly - 1 sets - 3 reps - 30-60 sec  hold - Standing Plantar Fascia Mobilization with Small Ball  - 1 x daily - 3-4 x weekly - 1 sets - 10 reps  ASSESSMENT:  CLINICAL IMPRESSION: AMERIA POSTLETHWAIT presents reporting increased pain on Sunday to where she couldn't get out of bed. Today was better, but she needs cueing to use correct heel strike with gait. She had increased pain with single leg heel lift, so we reverted to B heel raises. Once this was completed she was able to tolerate Left with 3 sec tempo. Initial trial of DN/MTwith excellent twitch responses. Advised use of tennis ball at home once per day. We  also discussed seeing a neurologist based on her symptoms which include tremors in the L UE and overheating and sweating with minimal exercise as well as abnormal gait pattern. Brynnlie continues to demonstrate potential for improvement and would benefit from continued skilled therapy to address impairments.  OBJECTIVE IMPAIRMENTS: Abnormal gait, decreased ROM, decreased strength, increased fascial restrictions, increased muscle spasms, impaired flexibility, postural dysfunction, and pain.   ACTIVITY LIMITATIONS: standing and locomotion level  PARTICIPATION LIMITATIONS: occupation  PERSONAL FACTORS: Time since onset of injury/illness/exacerbation are also affecting patient's functional outcome.   REHAB POTENTIAL: Good  CLINICAL DECISION MAKING: Stable/uncomplicated  EVALUATION COMPLEXITY: Low   GOALS: Goals reviewed with patient? Yes  SHORT TERM GOALS: Target date: 06/02/2023  Patient will be independent with initial HEP. Baseline:  Goal status: INITIAL  2.  Decreased pain in L heel by 30% with gait Baseline:  Goal status: INITIAL    LONG TERM GOALS: Target date: 06/30/2023   Patient will be independent with advanced/ongoing HEP to improve outcomes and carryover.  Baseline:  Goal status: INITIAL  2.  Patient will report at least 75% improvement in L foot pain to improve QOL. Baseline:  Goal status: INITIAL  3.  Patient will demonstrate improved L ankle active DF to  5 deg  to allow for normal gait and stair mechanics. Baseline:  Goal status: INITIAL  4.  Patient will be able to ambulate 600' a normal gait pattern without increased foot pain to access community.  Baseline:  Goal status: INITIAL  5. Patient will report 57/80 on LEFS  to demonstrate improved functional ability. Baseline: 48 / 80 = 60.0 % Goal status: INITIAL      PLAN:  PT FREQUENCY: 1-2x/week  PT DURATION: 8 weeks  PLANNED INTERVENTIONS: Therapeutic exercises, Therapeutic activity,  Neuromuscular re-education, Balance training, Gait training, Patient/Family education, Self Care, Joint mobilization, Stair training, Dry Needling, Electrical stimulation, Spinal mobilization, Cryotherapy, Moist heat, Taping, Ultrasound, Ionotophoresis 4mg /ml Dexamethasone, and Manual therapy  PLAN FOR NEXT SESSION:Assess MT/DN to quadratus plantae and gastroc/solues; review tempo heel lifts, [name] and/or ionto, gait training   Solon Palm, PT  05/12/2023, 5:25 PM

## 2023-05-12 ENCOUNTER — Encounter: Payer: Self-pay | Admitting: Physical Therapy

## 2023-05-12 ENCOUNTER — Ambulatory Visit: Payer: 59 | Admitting: Physical Therapy

## 2023-05-12 DIAGNOSIS — M79672 Pain in left foot: Secondary | ICD-10-CM

## 2023-05-12 DIAGNOSIS — R2689 Other abnormalities of gait and mobility: Secondary | ICD-10-CM

## 2023-05-12 DIAGNOSIS — R252 Cramp and spasm: Secondary | ICD-10-CM

## 2023-05-27 ENCOUNTER — Ambulatory Visit: Payer: 59

## 2023-05-27 ENCOUNTER — Other Ambulatory Visit: Payer: Self-pay | Admitting: Podiatry

## 2023-05-27 ENCOUNTER — Other Ambulatory Visit: Payer: Self-pay

## 2023-05-27 ENCOUNTER — Telehealth: Payer: Self-pay | Admitting: Podiatry

## 2023-05-27 DIAGNOSIS — M79672 Pain in left foot: Secondary | ICD-10-CM

## 2023-05-27 DIAGNOSIS — M722 Plantar fascial fibromatosis: Secondary | ICD-10-CM

## 2023-05-27 DIAGNOSIS — R29898 Other symptoms and signs involving the musculoskeletal system: Secondary | ICD-10-CM

## 2023-05-27 DIAGNOSIS — R2689 Other abnormalities of gait and mobility: Secondary | ICD-10-CM

## 2023-05-27 DIAGNOSIS — R252 Cramp and spasm: Secondary | ICD-10-CM

## 2023-05-27 NOTE — Telephone Encounter (Signed)
Notified pt referral was being sent  and she said thank you

## 2023-05-27 NOTE — Therapy (Signed)
OUTPATIENT PHYSICAL THERAPY LOWER EXTREMITY TREATMENT   Patient Name: Kristine Richmond MRN: [id number] DOB:[birth year], 67 y.o., female Today's Date: 05/27/2023  END OF SESSION:  PT End of Session - 05/27/23 1021     Visit Number 3    Date for PT Re-Evaluation 06/30/23    Authorization Type AETNA    PT Start Time 0802    PT Stop Time 0845    PT Time Calculation (min) 43 min               History reviewed. No pertinent past medical history. History reviewed. No pertinent surgical history. There are no problems to display for this patient.   PCP: Pcp, No   REFERRING PROVIDER: Louann Sjogren, DPM   REFERRING DIAG: M72.2 (ICD-10-CM) - Plantar fasciitis, left   THERAPY DIAG:  Pain in left foot  Cramp and spasm  Other abnormalities of gait and mobility  Rationale for Evaluation and Treatment: Rehabilitation  ONSET DATE: 7 months ago  SUBJECTIVE:   SUBJECTIVE STATEMENT: 05/27/23:  still very painful L foot, if I walk around any I am in so much pain the next day I need help to get out of bed.  Sometimes the pain wakes me in the middle of the night.  Eval: Last cortisone shot did help some. It's the best it's been. Had pf 10 years ago. Tried splints this time but had a lot of leg cramps so would kick them off. Back surgery was left side. No feeling in 4th toe from that. 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 She does the stretches but they don't help. Pain in heel right now. Now I don't walk right. Arch supports don't help. Left leg feels week and sometimes shaking.   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 2019 PAIN:  Are you having pain? Yes: NPRS scale: 5 up to 10/10 Pain location: left heel Pain description: stabbing Aggravating factors: walking, prolonged standing Relieving factors: ice or getting off of it, massager  PRECAUTIONS: None  RED FLAGS: None   WEIGHT BEARING  RESTRICTIONS: No  FALLS:  Has patient fallen in last 6 months? No  LIVING ENVIRONMENT: Lives with: lives with their family Lives in: House/apartment Stairs: Yes: Internal: 13 steps; on right going up Has following equipment at home: None  OCCUPATION: travels for work  PLOF: Independent  PATIENT GOALS: walk normal and exercise again   NEXT MD VISIT: none scheduled  OBJECTIVE:   DIAGNOSTIC FINDINGS: none  PATIENT SURVEYS:  LEFS 48 / 80 = 60.0 %  COGNITION: Overall cognitive status: Within functional limits for tasks assessed     SENSATION: Tingling in Left LE from mid thigh down   MUSCLE LENGTH: Marked HS Bil, left soleus  POSTURE:  weight shift forward  PALPATION: Deep palpation to L mid calcaneus insertion of fascia  LUMBAR ROM: WFL    LOWER EXTREMITY ROM:   ROM Right eval Left eval   Ankle dorsiflexion  2/8   Ankle plantarflexion  68   Ankle inversion  26   Ankle eversion  40    (Blank rows = not tested)  Great toe ext: WNL  LOWER EXTREMITY MMT:  MMT Right eval Left eval  Hip flexion 5 5  Hip extension 5 4+  Hip abduction  5  Hip adduction  5  Hip internal rotation    Hip external rotation    Knee flexion 5 5  Knee extension 5 4+  shaky  Ankle dorsiflexion 5 5  Ankle plantarflexion    Ankle inversion 5 4+  Ankle eversion 5 5   (Blank rows = not tested)   GAIT: Distance walked: 40 Assistive device utilized: None Level of assistance: Complete Independence Comments: ambulates with no heel strike left decreased knee flexion, decreased stance time left   TODAY'S TREATMENT:                                                                                                                              DATE:   05/27/23: measured L ankle dorsiflexion, AAROM 6 degrees compared to R over 22 degrees Manual:  supine for mobilization with movement for L ankle dorsiflexion, with AP glides L subtalar jt, gr 3  Prone for myofascial release med and  lat gastrocs and L soleus, pt with clonus reaction with this technique x 2  Standing for lunges L ankle with subtalar mobs, mob with movement for AP glides subtalar jt, 2 bouts 15 reps  Therex: Attempted stretch of L plantarflexors in standing with L forefoot on 2" book, manual cues to maintain L heel on ground and keep L knee extended, patient without discernable stretch L plantarflexors, changed to : Standing stretch L plantarflexors, with washcloth rolled under medial 3 MT heads, to control overpronation, patient noted stretch L plantarflexors, so advised to utilize this stretch now for a trial   05/12/23 Bike L 2 x 5.5 min Bil 3 sec tempo heel lifts x 10. Then L only x 10 (no pain) 1.5 min rest, L x 10 Seated fig 4 stretch bil x 20 sec Manual: Skilled palpation and monitoring of soft tissues during DN, STM to L plantar fascia and L gastroc/soleus Trigger Point Dry-Needling  Treatment instructions: Expect mild to moderate muscle soreness. S/S of pneumothorax if dry needled over a lung field, and to seek immediate medical attention should they occur. Patient verbalized understanding of these instructions and education. Patient Consent Given: Yes Education handout provided: Previously provided Muscles treated: L quadratus plantae, lateral gastroc Electrical stimulation performed: No Parameters: N/A Treatment response/outcome: Twitch Response Elicited and Palpable Increase in Muscle Length    05/05/23 See pt ed and HEP   PATIENT EDUCATION:  Education details: PT eval findings, anticipated POC, and HEP update  Person educated: Patient Education method: Explanation, Demonstration, and Handouts Education comprehension: verbalized understanding and returned demonstration  HOME EXERCISE PROGRAM: Access Code: [email or web address] URL: [email or web address] Date: 05/12/2023 Prepared by: Raynelle Fanning  Exercises - Single Leg Heel Raise  - 1-2 x daily - 7 x weekly - 3 sets - 10 reps - 3 sec  hold - Soleus Stretch on Wall  - 2 x daily - 7 x weekly - 1 sets - 3 reps - 30-60 sec hold - Seated Piriformis Stretch with Trunk Bend  - 2 x daily - 7 x weekly - 1 sets - 3 reps - 30-60 sec  hold - Standing Plantar Fascia Mobilization with Small  Ball  - 1 x daily - 3-4 x weekly - 1 sets - 10 reps  ASSESSMENT:  CLINICAL IMPRESSION: Kristine Richmond presents reporting no real change in her pain L heel with current stretching and pain management techniques with skilled PT.   Added mid foot and subtalar jt mobilizations to today's skilled treatment techniques, also changed her position/ technique for stretching her L ankle plantarflexors.  Concerning today was several episodes of involuntary inversion of L foot,with position changes, also + Babinski on L and clonus activity with myofascial release/deep massage L medial plantarflexors.  Combined with tremor L arm and L Leg and no arm swing, again reinforced to the patient  seeing a neurologist. She did report improved Sx following today's techniques, however she has ? tone influencing the resting position of L foot/ankle which may be contributing to her pain.  Leianne continues to demonstrate potential for improvement and would benefit from continued skilled therapy to address impairments.     OBJECTIVE IMPAIRMENTS: Abnormal gait, decreased ROM, decreased strength, increased fascial restrictions, increased muscle spasms, impaired flexibility, postural dysfunction, and pain.   ACTIVITY LIMITATIONS: standing and locomotion level  PARTICIPATION LIMITATIONS: occupation  PERSONAL FACTORS: Time since onset of injury/illness/exacerbation are also affecting patient's functional outcome.   REHAB POTENTIAL: Good  CLINICAL DECISION MAKING: Stable/uncomplicated  EVALUATION COMPLEXITY: Low   GOALS: Goals reviewed with patient? Yes  SHORT TERM GOALS: Target date: 06/02/2023  Patient will be independent with initial HEP. Baseline:  Goal status: INITIAL  2.   Decreased pain in L heel by 30% with gait Baseline:  Goal status: INITIAL    LONG TERM GOALS: Target date: 06/30/2023   Patient will be independent with advanced/ongoing HEP to improve outcomes and carryover.  Baseline:  Goal status: INITIAL  2.  Patient will report at least 75% improvement in L foot pain to improve QOL. Baseline:  Goal status: INITIAL  3.  Patient will demonstrate improved L ankle active DF to  5 deg  to allow for normal gait and stair mechanics. Baseline:  Goal status: INITIAL  4.  Patient will be able to ambulate 600' a normal gait pattern without increased foot pain to access community.  Baseline:  Goal status: INITIAL  5. Patient will report 57/80 on LEFS  to demonstrate improved functional ability. Baseline: 48 / 80 = 60.0 % Goal status: INITIAL      PLAN:  PT FREQUENCY: 1-2x/week  PT DURATION: 8 weeks  PLANNED INTERVENTIONS: Therapeutic exercises, Therapeutic activity, Neuromuscular re-education, Balance training, Gait training, Patient/Family education, Self Care, Joint mobilization, Stair training, Dry Needling, Electrical stimulation, Spinal mobilization, Cryotherapy, Moist heat, Taping, Ultrasound, Ionotophoresis 4mg /ml Dexamethasone, and Manual therapy  PLAN FOR NEXT SESSION: review the HEP, continue to assess for DN, how was manual, ? Possible kinesiotape for L foot, ankle  Pristine Gladhill, PT, DPT, OCS 05/27/2023, 3:25 PM

## 2023-05-27 NOTE — Telephone Encounter (Signed)
I will send a referral over for her and they should give her a call.

## 2023-05-27 NOTE — Telephone Encounter (Signed)
Pt called and has been going to PT and they are recommending she see a neurologist due to some of the things they are noticing with the PT. They cannot referr patient. And pt is asking if you would refer her to like to be referred to [facility].

## 2023-05-28 ENCOUNTER — Encounter: Payer: Self-pay | Admitting: Neurology

## 2023-06-04 ENCOUNTER — Ambulatory Visit: Payer: 59 | Attending: Podiatry

## 2023-06-04 DIAGNOSIS — M79672 Pain in left foot: Secondary | ICD-10-CM

## 2023-06-04 DIAGNOSIS — R252 Cramp and spasm: Secondary | ICD-10-CM

## 2023-06-04 DIAGNOSIS — R2689 Other abnormalities of gait and mobility: Secondary | ICD-10-CM | POA: Diagnosis present

## 2023-06-04 NOTE — Therapy (Addendum)
OUTPATIENT PHYSICAL THERAPY LOWER EXTREMITY TREATMENT   Patient Name: Kristine Richmond MRN: [id number] DOB:[birth year], 67 y.o., female Today's Date: 06/04/2023  END OF SESSION:  PT End of Session - 06/04/23 0807     Visit Number 4    Date for PT Re-Evaluation 06/30/23    Authorization Type AET[employer]    PT Start Time 0803    PT Stop Time 0845    PT Time Calculation (min) 42 min               History reviewed. No pertinent past medical history. History reviewed. No pertinent surgical history. There are no problems to display for this patient.   PCP: Pcp, No   REFERRING PROVIDER: Louann Sjogren, DPM   REFERRING DIAG: M72.2 (ICD-10-CM) - Plantar fasciitis, left   THERAPY DIAG:  Pain in left foot  Cramp and spasm  Other abnormalities of gait and mobility  Rationale for Evaluation and Treatment: Rehabilitation  ONSET DATE: 7 months ago  SUBJECTIVE:   SUBJECTIVE STATEMENT: Nothing helping so far.  Eval: Last cortisone shot did help some. It's the best it's been. Had pf 10 years ago. Tried splints this time but had a lot of leg cramps so would kick them off. Back surgery was left side. No feeling in 4th toe from that. 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 She does the stretches but they don't help. Pain in heel right now. Now I don't walk right. Arch supports don't help. Left leg feels week and sometimes shaking.   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 2019 PAIN:  Are you having pain? Yes: NPRS scale: 5 up to 10/10 Pain location: left heel Pain description: stabbing Aggravating factors: walking, prolonged standing Relieving factors: ice or getting off of it, massager  PRECAUTIONS: None  RED FLAGS: None   WEIGHT BEARING RESTRICTIONS: No  FALLS:  Has patient fallen in last 6 months? No  LIVING ENVIRONMENT: Lives with: lives with their family Lives in: House/apartment Stairs:  Yes: Internal: 13 steps; on right going up Has following equipment at home: None  OCCUPATION: travels for work  PLOF: Independent  PATIENT GOALS: walk normal and exercise again   NEXT MD VISIT: none scheduled  OBJECTIVE:   DIAGNOSTIC FINDINGS: none  PATIENT SURVEYS:  LEFS 48 / 80 = 60.0 %  COGNITION: Overall cognitive status: Within functional limits for tasks assessed     SENSATION: Tingling in Left LE from mid thigh down   MUSCLE LENGTH: Marked HS Bil, left soleus  POSTURE:  weight shift forward  PALPATION: Deep palpation to L mid calcaneus insertion of fascia  LUMBAR ROM: WFL    LOWER EXTREMITY ROM:   ROM Right eval Left eval   Ankle dorsiflexion  2/8   Ankle plantarflexion  68   Ankle inversion  26   Ankle eversion  40    (Blank rows = not tested)  Great toe ext: WNL  LOWER EXTREMITY MMT:  MMT Right eval Left eval  Hip flexion 5 5  Hip extension 5 4+  Hip abduction  5  Hip adduction  5  Hip internal rotation    Hip external rotation    Knee flexion 5 5  Knee extension 5 4+ shaky  Ankle dorsiflexion 5 5  Ankle plantarflexion    Ankle inversion 5 4+  Ankle eversion 5 5   (Blank rows = not tested)   GAIT: Distance walked: 40 Assistive device utilized:  None Level of assistance: Complete Independence Comments: ambulates with no heel strike left decreased knee flexion, decreased stance time left   TODAY'S TREATMENT:                                                                                                                              DATE:   06/04/23 Bike L2x58min Seated ankle circles CW/CCW x 20 Seated toe curls x 20 STM to L medial gastroc, medial heel in prone Long sitting gastroc stretch and solues stretch w/ towel x 30 sec Toe spreads x 10  Big toe yoga x 10    05/27/23: measured L ankle dorsiflexion, AAROM 6 degrees compared to R over 22 degrees Manual:  supine for mobilization with movement for L ankle dorsiflexion, with  AP glides L subtalar jt, gr 3  Prone for myofascial release med and lat gastrocs and L soleus, pt with clonus reaction with this technique x 2  Standing for lunges L ankle with subtalar mobs, mob with movement for AP glides subtalar jt, 2 bouts 15 reps  Therex: Attempted stretch of L plantarflexors in standing with L forefoot on 2" book, manual cues to maintain L heel on ground and keep L knee extended, patient without discernable stretch L plantarflexors, changed to : Standing stretch L plantarflexors, with washcloth rolled under medial 3 MT heads, to control overpronation, patient noted stretch L plantarflexors, so advised to utilize this stretch now for a trial   05/12/23 Bike L 2 x 5.5 min Bil 3 sec tempo heel lifts x 10. Then L only x 10 (no pain) 1.5 min rest, L x 10 Seated fig 4 stretch bil x 20 sec Manual: Skilled palpation and monitoring of soft tissues during DN, STM to L plantar fascia and L gastroc/soleus Trigger Point Dry-Needling  Treatment instructions: Expect mild to moderate muscle soreness. S/S of pneumothorax if dry needled over a lung field, and to seek immediate medical attention should they occur. Patient verbalized understanding of these instructions and education. Patient Consent Given: Yes Education handout provided: Previously provided Muscles treated: L quadratus plantae, lateral gastroc Electrical stimulation performed: No Parameters: N/A Treatment response/outcome: Twitch Response Elicited and Palpable Increase in Muscle Length    05/05/23 See pt ed and HEP   PATIENT EDUCATION:  Education details: PT eval findings, anticipated POC, and HEP update  Person educated: Patient Education method: Explanation, Demonstration, and Handouts Education comprehension: verbalized understanding and returned demonstration  HOME EXERCISE PROGRAM: Access Code: [email or web address] URL: [email or web address] Date: 06/04/2023 Prepared by: Verta Ellen  Exercises -  Single Leg Heel Raise  - 1-2 x daily - 7 x weekly - 3 sets - 10 reps - 3 sec hold - Soleus Stretch on Wall  - 2 x daily - 7 x weekly - 1 sets - 3 reps - 30-60 sec hold - Seated Piriformis Stretch with Trunk Bend  - 2 x daily - 7 x weekly - 1 sets - 3 reps -  30-60 sec  hold - Standing Plantar Fascia Mobilization with Small Ball  - 1 x daily - 3-4 x weekly - 1 sets - 10 reps - Toe Yoga - Alternating Great Toe and Lesser Toe Extension  - 1 x daily - 7 x weekly - 3 sets - 10 reps - Towel Scrunches  - 1 x daily - 7 x weekly - 3 sets - 10 reps - Toe Spreading  - 1 x daily - 7 x weekly - 3 sets - 10 reps  ASSESSMENT:  CLINICAL IMPRESSION: Pt at this point denies any lasting improvement from PT thus far. Worked on STM of the heel and surrounding musculature to reduce muscle restrictions and help with pain. Emphasized more intrinsic foot strengthening and ROM with exercises. Is scheduled to see Neurologist next month. Good response today, continues to show benefit from therapy.   OBJECTIVE IMPAIRMENTS: Abnormal gait, decreased ROM, decreased strength, increased fascial restrictions, increased muscle spasms, impaired flexibility, postural dysfunction, and pain.   ACTIVITY LIMITATIONS: standing and locomotion level  PARTICIPATION LIMITATIONS: occupation  PERSONAL FACTORS: Time since onset of injury/illness/exacerbation are also affecting patient's functional outcome.   REHAB POTENTIAL: Good  CLINICAL DECISION MAKING: Stable/uncomplicated  EVALUATION COMPLEXITY: Low   GOALS: Goals reviewed with patient? Yes  SHORT TERM GOALS: Target date: 06/02/2023  Patient will be independent with initial HEP. Baseline:  Goal status: MET  2.  Decreased pain in L heel by 30% with gait Baseline:  Goal status: IN PROGRESS- 06/04/23 - no improvement yet    LONG TERM GOALS: Target date: 06/30/2023   Patient will be independent with advanced/ongoing HEP to improve outcomes and carryover.  Baseline:  Goal  status: IN PROGRESS  2.  Patient will report at least 75% improvement in L foot pain to improve QOL. Baseline:  Goal status: IN PROGRESS  3.  Patient will demonstrate improved L ankle active DF to  5 deg  to allow for normal gait and stair mechanics. Baseline:  Goal status: IN PROGRESS  4.  Patient will be able to ambulate 600' a normal gait pattern without increased foot pain to access community.  Baseline:  Goal status: IN PROGRESS  5. Patient will report 57/80 on LEFS  to demonstrate improved functional ability. Baseline: 48 / 80 = 60.0 % Goal status: IN PROGRESS      PLAN:  PT FREQUENCY: 1-2x/week  PT DURATION: 8 weeks  PLANNED INTERVENTIONS: Therapeutic exercises, Therapeutic activity, Neuromuscular re-education, Balance training, Gait training, Patient/Family education, Self Care, Joint mobilization, Stair training, Dry Needling, Electrical stimulation, Spinal mobilization, Cryotherapy, Moist heat, Taping, Ultrasound, Ionotophoresis 4mg /ml Dexamethasone, and Manual therapy  PLAN FOR NEXT SESSION: review the HEP, continue to assess for DN, how was manual, ? try kinesiotape for L foot, ankle  Darleene Cleaver, PTA  06/04/2023, 8:56 AM

## 2023-06-10 ENCOUNTER — Ambulatory Visit: Payer: 59

## 2023-06-17 ENCOUNTER — Ambulatory Visit: Payer: 59

## 2023-06-17 DIAGNOSIS — R252 Cramp and spasm: Secondary | ICD-10-CM

## 2023-06-17 DIAGNOSIS — M79672 Pain in left foot: Secondary | ICD-10-CM | POA: Diagnosis not present

## 2023-06-17 DIAGNOSIS — R2689 Other abnormalities of gait and mobility: Secondary | ICD-10-CM

## 2023-06-17 NOTE — Therapy (Addendum)
OUTPATIENT PHYSICAL THERAPY LOWER EXTREMITY TREATMENT AND DISCHARGE SUMMARY   Patient Name: Kristine Richmond MRN: [id number] DOB:07-08-1956, 67 y.o., female Today's Date: 06/17/2023  END OF SESSION:  PT End of Session - 06/17/23 1236     Visit Number 5    Date for PT Re-Evaluation 06/30/23    Authorization Type AETNA    PT Start Time 1149    PT Stop Time 1231    PT Time Calculation (min) 42 min    Activity Tolerance Patient tolerated treatment well    Behavior During Therapy [facility] for tasks assessed/performed                History reviewed. No pertinent past medical history. History reviewed. No pertinent surgical history. There are no problems to display for this patient.   PCP: Pcp, No   REFERRING PROVIDER: Louann Sjogren, DPM   REFERRING DIAG: M72.2 (ICD-10-CM) - Plantar fasciitis, left   THERAPY DIAG:  Pain in left foot  Cramp and spasm  Other abnormalities of gait and mobility  Rationale for Evaluation and Treatment: Rehabilitation  ONSET DATE: 7 months ago  SUBJECTIVE:   SUBJECTIVE STATEMENT: Pt reports she is now experiencing N/T   Eval: Last cortisone shot did help some. It's the best it's been. Had pf 10 years ago. Tried splints this time but had a lot of leg cramps so would kick them off. Back surgery was left side. No feeling in 4th toe from that. 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 She does the stretches but they don't help. Pain in heel right now. Now I don't walk right. Arch supports don't help. Left leg feels week and sometimes shaking.   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 2019 PAIN:  Are you having pain? Yes: NPRS scale: 5 up to 10/10 Pain location: left heel Pain description: stabbing Aggravating factors: walking, prolonged standing Relieving factors: ice or getting off of it, massager  PRECAUTIONS: None  RED FLAGS: None   WEIGHT BEARING  RESTRICTIONS: No  FALLS:  Has patient fallen in last 6 months? No  LIVING ENVIRONMENT: Lives with: lives with their family Lives in: House/apartment Stairs: Yes: Internal: 13 steps; on right going up Has following equipment at home: None  OCCUPATION: travels for work  PLOF: Independent  PATIENT GOALS: walk normal and exercise again   NEXT MD VISIT: none scheduled  OBJECTIVE:   DIAGNOSTIC FINDINGS: none  PATIENT SURVEYS:  LEFS 48 / 80 = 60.0 %  COGNITION: Overall cognitive status: Within functional limits for tasks assessed     SENSATION: Tingling in Left LE from mid thigh down   MUSCLE LENGTH: Marked HS Bil, left soleus  POSTURE:  weight shift forward  PALPATION: Deep palpation to L mid calcaneus insertion of fascia  LUMBAR ROM: WFL    LOWER EXTREMITY ROM:   ROM Right eval Left eval 06/17/23 L  Ankle dorsiflexion  2/8 0  Ankle plantarflexion  68   Ankle inversion  26   Ankle eversion  40    (Blank rows = not tested)  Great toe ext: WNL  LOWER EXTREMITY MMT:  MMT Right eval Left eval  Hip flexion 5 5  Hip extension 5 4+  Hip abduction  5  Hip adduction  5  Hip internal rotation    Hip external rotation    Knee flexion 5 5  Knee extension 5 4+ shaky  Ankle dorsiflexion 5 5  Ankle plantarflexion  Ankle inversion 5 4+  Ankle eversion 5 5   (Blank rows = not tested)   GAIT: Distance walked: 40 Assistive device utilized: None Level of assistance: Complete Independence Comments: ambulates with no heel strike left decreased knee flexion, decreased stance time left   TODAY'S TREATMENT:                                                                                                                              DATE:  06/17/23 Bike X STM to L medial gastroc, medial heel in prone Gastroc and solues stretch with towel x 30 sec each 06/04/23 Bike L2x36min Seated ankle circles CW/CCW x 20 Seated toe curls x 20 STM to L medial gastroc,  medial heel in prone Long sitting gastroc stretch and solues stretch w/ towel x 30 sec Toe spreads x 10  Big toe yoga x 10    05/27/23: measured L ankle dorsiflexion, AAROM 6 degrees compared to R over 22 degrees Manual:  supine for mobilization with movement for L ankle dorsiflexion, with AP glides L subtalar jt, gr 3  Prone for myofascial release med and lat gastrocs and L soleus, pt with clonus reaction with this technique x 2  Standing for lunges L ankle with subtalar mobs, mob with movement for AP glides subtalar jt, 2 bouts 15 reps  Therex: Attempted stretch of L plantarflexors in standing with L forefoot on 2" book, manual cues to maintain L heel on ground and keep L knee extended, patient without discernable stretch L plantarflexors, changed to : Standing stretch L plantarflexors, with washcloth rolled under medial 3 MT heads, to control overpronation, patient noted stretch L plantarflexors, so advised to utilize this stretch now for a trial   05/12/23 Bike L 2 x 5.5 min Bil 3 sec tempo heel lifts x 10. Then L only x 10 (no pain) 1.5 min rest, L x 10 Seated fig 4 stretch bil x 20 sec Manual: Skilled palpation and monitoring of soft tissues during DN, STM to L plantar fascia and L gastroc/soleus Trigger Point Dry-Needling  Treatment instructions: Expect mild to moderate muscle soreness. S/S of pneumothorax if dry needled over a lung field, and to seek immediate medical attention should they occur. Patient verbalized understanding of these instructions and education. Patient Consent Given: Yes Education handout provided: Previously provided Muscles treated: L quadratus plantae, lateral gastroc Electrical stimulation performed: No Parameters: N/A Treatment response/outcome: Twitch Response Elicited and Palpable Increase in Muscle Length    05/05/23 See pt ed and HEP   PATIENT EDUCATION:  Education details: PT eval findings, anticipated POC, and HEP update  Person educated:  Patient Education method: Explanation, Demonstration, and Handouts Education comprehension: verbalized understanding and returned demonstration  HOME EXERCISE PROGRAM: Access Code: [email or web address] URL: [email or web address] Date: 06/04/2023 Prepared by: Verta Ellen  Exercises - Single Leg Heel Raise  - 1-2 x daily - 7 x weekly - 3 sets - 10 reps - 3 sec  hold - Soleus Stretch on Wall  - 2 x daily - 7 x weekly - 1 sets - 3 reps - 30-60 sec hold - Seated Piriformis Stretch with Trunk Bend  - 2 x daily - 7 x weekly - 1 sets - 3 reps - 30-60 sec  hold - Standing Plantar Fascia Mobilization with Small Ball  - 1 x daily - 3-4 x weekly - 1 sets - 10 reps - Toe Yoga - Alternating Great Toe and Lesser Toe Extension  - 1 x daily - 7 x weekly - 3 sets - 10 reps - Towel Scrunches  - 1 x daily - 7 x weekly - 3 sets - 10 reps - Toe Spreading  - 1 x daily - 7 x weekly - 3 sets - 10 reps  ASSESSMENT:  CLINICAL IMPRESSION: Pt continues to deny much improvement with PT thus far. She continues to have numbness in toes on L foot and notes increased N/T along her LLE. She only notes benefit from [facility] and joint mobs, so focused on this today with good response from patient. She will see neurologist on 9/23, limited with exercises d/t N/T in both LE as well as pain in L achilles and heel.   OBJECTIVE IMPAIRMENTS: Abnormal gait, decreased ROM, decreased strength, increased fascial restrictions, increased muscle spasms, impaired flexibility, postural dysfunction, and pain.   ACTIVITY LIMITATIONS: standing and locomotion level  PARTICIPATION LIMITATIONS: occupation  PERSONAL FACTORS: Time since onset of injury/illness/exacerbation are also affecting patient's functional outcome.   REHAB POTENTIAL: Good  CLINICAL DECISION MAKING: Stable/uncomplicated  EVALUATION COMPLEXITY: Low   GOALS: Goals reviewed with patient? Yes  SHORT TERM GOALS: Target date: 06/02/2023  Patient will be independent  with initial HEP. Baseline:  Goal status: MET  2.  Decreased pain in L heel by 30% with gait Baseline:  Goal status: IN PROGRESS- 06/04/23 - no improvement yet    LONG TERM GOALS: Target date: 06/30/2023   Patient will be independent with advanced/ongoing HEP to improve outcomes and carryover.  Baseline:  Goal status: IN PROGRESS  2.  Patient will report at least 75% improvement in L foot pain to improve QOL. Baseline:  Goal status: IN PROGRESS  3.  Patient will demonstrate improved L ankle active DF to  5 deg  to allow for normal gait and stair mechanics. Baseline:  Goal status: IN PROGRESS- 06/17/23 0 deg  4.  Patient will be able to ambulate 600' a normal gait pattern without increased foot pain to access community.  Baseline:  Goal status: IN PROGRESS  5. Patient will report 57/80 on LEFS  to demonstrate improved functional ability. Baseline: 48 / 80 = 60.0 % Goal status: IN PROGRESS      PLAN:  PT FREQUENCY: 1-2x/week  PT DURATION: 8 weeks  PLANNED INTERVENTIONS: Therapeutic exercises, Therapeutic activity, Neuromuscular re-education, Balance training, Gait training, Patient/Family education, Self Care, Joint mobilization, Stair training, Dry Needling, Electrical stimulation, Spinal mobilization, Cryotherapy, Moist heat, Taping, Ultrasound, Ionotophoresis 4mg /ml Dexamethasone, and Manual therapy  PLAN FOR NEXT SESSION: F/U on neurology apt; review the HEP, continue to assess for DN, how was manual, ? try kinesiotape for L foot, ankle  Job Holtsclaw L Rosalva Neary, PTA  06/17/2023, 12:51 PM  PHYSICAL THERAPY DISCHARGE SUMMARY  Visits from Start of Care: 5  Current functional level related to goals / functional outcomes: Unknown as patient did not return for f/u visits   Remaining deficits: Unknown as patient did not return for f/u visits   Education /  Equipment: HEP   Patient agrees to discharge. Patient goals were not met. Patient is being discharged due to not  returning since the last visit. Patient was being assessed by neurology and this likely contributed to her not returning to finish PT.   Solon Palm, PT 07/30/23 10:11 AM

## 2023-06-23 ENCOUNTER — Ambulatory Visit (INDEPENDENT_AMBULATORY_CARE_PROVIDER_SITE_OTHER): Payer: 59 | Admitting: Neurology

## 2023-06-23 ENCOUNTER — Encounter: Payer: Self-pay | Admitting: Neurology

## 2023-06-23 VITALS — BP 134/84 | HR 96 | Ht 67.0 in | Wt 258.9 lb

## 2023-06-23 DIAGNOSIS — Z9889 Other specified postprocedural states: Secondary | ICD-10-CM | POA: Diagnosis not present

## 2023-06-23 DIAGNOSIS — G959 Disease of spinal cord, unspecified: Secondary | ICD-10-CM | POA: Diagnosis not present

## 2023-06-23 DIAGNOSIS — R251 Tremor, unspecified: Secondary | ICD-10-CM

## 2023-06-23 DIAGNOSIS — R202 Paresthesia of skin: Secondary | ICD-10-CM

## 2023-06-23 DIAGNOSIS — R200 Anesthesia of skin: Secondary | ICD-10-CM | POA: Diagnosis not present

## 2023-06-23 DIAGNOSIS — R258 Other abnormal involuntary movements: Secondary | ICD-10-CM | POA: Diagnosis not present

## 2023-06-23 DIAGNOSIS — G8929 Other chronic pain: Secondary | ICD-10-CM | POA: Insufficient documentation

## 2023-06-23 MED ORDER — GABAPENTIN 300 MG PO CAPS: ORAL_CAPSULE | ORAL | 3 refills | Status: DC

## 2023-06-23 NOTE — Progress Notes (Signed)
[facility] Neurology Division Clinic Note - Initial Visit   Date: 06/23/2023   Kristine Richmond MRN: [id number] DOB: Mar 06, 1956   Dear Dr Ralene Cork:  Thank you for your kind referral of Kristine Richmond for consultation of left leg tingling. Although her history is well known to you, please allow [name] to reiterate it for the purpose of our medical record. The patient was accompanied to the clinic by self.    Kristine Richmond is a 67 y.o. female with history of thoracic schwannoma s/p resection (Dr. Wynetta Emery), secondary hypothyroidism s/p thyroidectomy for tumor presenting for evaluation of left leg tingling.   IMPRESSION/PLAN: Possible myelopathy manifesting with left leg tingling, left hand tremor.  Exam shows diffuse brisk reflexes involving the arms and leg with clonus and Hoffman's sign on the left.  These findings are suggestive of myelopathy.  She has known history of thoracic schwannoma s/p resection in 2005.  To evaluate for structural pathology, I will check MRI cervical spine and thoracic spine wwo contrast.  Additional imaging of the lumbar spine may be indicated going forward.  For paresthesias, start gabapentin 300mg  at bedtime.  Further recommendations pending results.   ------------------------------------------------------------- History of present illness: Starting in January 2024, she began having left foot plantar fasciitis for which she was doing PT.  During her PT, she began having left mid-thigh down into the lower leg and foot.   She also has weakness in the left foot with extension.  No falls in the past year.  She walks unassisted.  Her physical therapist noticed that she has left ankle clonus and reduced ROM with dorsiflexion, so recommended that she consider seeing neurology.  She also has left hand tremor which is new and feels that her strength and movements are lower on the left.  Her mother also has tremors of both hands.    She has constant numbness of the left 4th  since back injury at the age of 82.  She also had history of thoracic schwannoma which was removed by Dr. Wynetta Emery in 2005.    Out-side paper records, electronic medical record, and images have been reviewed where available and summarized as:  MRI thoracic spine wwo contrast 08/30/2011: 1.  Upper thoracic facet arthropathy is present without  impingement.  2. Left paracentral disc protrusion at T3-4 mildly flattens the  anterior margin of the thoracic cord, but without overt central  stenosis - there is considerable CSF posterior to the cord at this  level.  3. Degenerative disc disease at several additional levels in the  thoracic spine is not causing impingement.  4.  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  If not previously worked up, thyroid ultrasound  would be recommended.  5.  I do not observe a recurrent osteoblastoma or schwannoma.     Past Medical History:  Diagnosis Date   DDD (degenerative disc disease), cervical    DDD (degenerative disc disease), lumbar    L4-L5   Hypothyroidism    thyorid removed   Tumor    back    Past Surgical History:  Procedure Laterality Date   DILATION AND CURETTAGE OF UTERUS     3   THYROIDECTOMY     tumor removed Left    back     Medications:  Outpatient Encounter Medications as of 06/23/2023  Medication Sig   albuterol (VENTOLIN HFA) 108 (90 Base) MCG/ACT inhaler  TAKE 2 PUFF(S) INHALED EVERY 6 HOURS, AS NEEDED   Bromfenac Sodium 0.09 % SOLN    fluticasone (FLONASE) 50 MCG/ACT nasal spray 2 sprays every morning for 30 days.   gabapentin (NEURONTIN) 300 MG capsule Start 1 tablet at bedtime x 1 week, then increase to 1 tablet twice daily.   levothyroxine (SYNTHROID) 112 MCG tablet Take one tab po daily except take an additional 1/2 tab on mondays   liothyronine (CYTOMEL) 5 MCG tablet Take 1 tablet by mouth daily.   magnesium 30 MG  tablet Take 30 mg by mouth 2 (two) times daily.   Turmeric (QC TUMERIC COMPLEX PO) Take by mouth.   vitamin B-12 (CYANOCOBALAMIN) 100 MCG tablet Take 100 mcg by mouth in the morning and at bedtime.   [DISCONTINUED] dapagliflozin propanediol (FARXIGA) 10 MG TABS tablet    [DISCONTINUED] Vitamin D, Ergocalciferol, (DRISDOL) 1.25 MG (50000 UNIT) CAPS capsule Take 1 capsule by mouth once a week.   No facility-administered encounter medications on file as of 06/23/2023.    Allergies:  Allergies  Allergen Reactions   Amoxicillin-Pot Clavulanate Other (See Comments)   Morphine Nausea Only and Other (See Comments)   Moxifloxacin Other (See Comments) and Nausea Only    Dizziness  Dizziness  Dizziness    Family History: Family History  Problem Relation Age of Onset   Tremor Mother    Parkinson's disease Father    Other Sister        Schwannoma    Social History:   Social History   Social History Narrative   Not on file    Vital Signs:  BP 134/84   Pulse 96   Ht 5\' 7"  (1.702 m)   Wt 258 lb 14.4 oz (117.4 kg)   SpO2 95%   BMI 40.55 kg/m   Neurological Exam: MENTAL STATUS including orientation to time, place, person, recent and remote memory, attention span and concentration, language, and fund of knowledge is normal.  Speech is not dysarthric.  CRANIAL NERVES: II:  No visual field defects.     III-IV-VI: Pupils equal round and reactive to light.  Normal conjugate, extra-ocular eye movements in all directions of gaze.  No nystagmus.  No ptosis.   V:  Normal facial sensation.    VII:  Normal facial symmetry and movements.   VIII:  Normal hearing and vestibular function.   IX-X:  Normal palatal movement.   XI:  Normal shoulder shrug and head rotation.   XII:  Normal tongue strength and range of motion, no deviation or fasciculation.  MOTOR:  Mild postural tremor of the left hand when outstretched.  No atrophy, fasciculations or abnormal movements.  No pronator drift.    Upper Extremity:  Right  Left  Deltoid  5/5   5/5   Biceps  5/5   5/5   Triceps  5/5   5/5   Wrist extensors  5/5   5/5   Wrist flexors  5/5   5/5   Finger extensors  5/5   5/5   Finger flexors  5/5   5/5   Dorsal interossei  5/5   5/5   Abductor pollicis  5/5   5/5   Tone (Ashworth scale)  0  0   Lower Extremity:  Right  Left  Hip flexors  5/5   5/5   Hip extensors  5/5   5/5   Knee flexors  5/5   5/5   Knee extensors  5/5   5/5  Dorsiflexors  5/5   5/5   Plantarflexors  5/5   5/5   Toe extensors  5/5   5/5   Toe flexors  5/5   5/5   Tone (Ashworth scale)  0  0   MSRs:                                           Right        Left brachioradialis 2+  3+  biceps 2+  3+  triceps 2+  3+  patellar 2+  3+  ankle jerk 2+  3+  Hoffman no  yes  plantar response down  down  Sustained left ankle clonus  2-3 beat ankle clonus on the right  SENSORY:  Normal and symmetric perception of light touch, pinprick, vibration, and temperature.    COORDINATION/GAIT: Normal finger-to- nose-finger. Finger tapping on the right intact, mild decrement in amplitude with finger tapping on the left.  Toe tapping slower on the left compared to right.  Gait is mildly antalgic due to left foot pain, unassisted and stable.    Thank you for allowing me to participate in patient's care.  If I can answer any additional questions, I would be pleased to do so.    Sincerely,    Kiera Hussey K. Allena Katz, DO

## 2023-06-23 NOTE — Patient Instructions (Addendum)
MRI cervical spine and thoracic spine  Start gabapentin 300mg  at bedtime x 1 week, then increase to 1 tablet twice daily

## 2023-06-26 ENCOUNTER — Ambulatory Visit: Payer: 59

## 2023-06-27 ENCOUNTER — Ambulatory Visit (INDEPENDENT_AMBULATORY_CARE_PROVIDER_SITE_OTHER): Payer: 59 | Admitting: Podiatry

## 2023-06-27 ENCOUNTER — Encounter: Payer: Self-pay | Admitting: Podiatry

## 2023-06-27 DIAGNOSIS — G959 Disease of spinal cord, unspecified: Secondary | ICD-10-CM

## 2023-06-27 DIAGNOSIS — R200 Anesthesia of skin: Secondary | ICD-10-CM

## 2023-06-27 DIAGNOSIS — M722 Plantar fascial fibromatosis: Secondary | ICD-10-CM | POA: Diagnosis not present

## 2023-06-27 MED ORDER — TRIAMCINOLONE ACETONIDE 10 MG/ML IJ SUSP
2.5000 mg | Freq: Once | INTRAMUSCULAR | Status: AC
Start: 2023-06-27 — End: 2023-06-27
  Administered 2023-06-27: 2.5 mg via INTRA_ARTICULAR

## 2023-06-27 MED ORDER — DEXAMETHASONE SODIUM PHOSPHATE 120 MG/30ML IJ SOLN
4.0000 mg | Freq: Once | INTRAMUSCULAR | Status: AC
Start: 2023-06-27 — End: 2023-06-27
  Administered 2023-06-27: 4 mg via INTRA_ARTICULAR

## 2023-06-27 NOTE — Progress Notes (Signed)
Subjective:  Patient ID: Kristine Richmond, female    DOB: 09-18-1956,   MRN: [id number]  No chief complaint on file.   67 y.o. female presents for follow-up of left foot plantar fasciitis and firboma. Relates the pain is still present but better. She relates last injeciton helped but going on another trip and hoping for some more relief. Has been in PT but was making issues worse and referral for neurology was placed. Patient currently being worked up and possibility for myelopathy.   Past Medical History:  Diagnosis Date   DDD (degenerative disc disease), cervical    DDD (degenerative disc disease), lumbar    L4-L5   Hypothyroidism    thyorid removed   Tumor    back    Objective:  Physical Exam: Vascular: DP/PT pulses 2/4 bilateral. CFT <3 seconds. Normal hair growth on digits. No edema.  Skin. No lacerations or abrasions bilateral feet.  Musculoskeletal: MMT 5/5 bilateral lower extremities in DF, PF, Inversion and Eversion. Deceased ROM in DF of ankle joint. Firm soft tissue mass in plantar fascia has improved and no longer palpable. Tender today to the medial calcaneal tubercle. No pain in arch. No pain along PT or achilles tendon. No pain with calcaneal squeeze.  Neurological: Sensation intact to light touch.   Assessment:   1. Plantar fasciitis, left          Plan:  Patient was evaluated and treated and all questions answered. Discussed plantar fibroma with patient.  X-rays deferred today.  Reviewed neurology notes. Awaiting further imaging as possible myelopathy going on.   Discussed treatment options including, ice, NSAIDS, supportive shoes, bracing, and stretching. Continue stretching.  Continue night splint.  Patient requesting injection today. Procedure note below.   Follow-up as needed.   Procedure:  Discussed etiology, pathology, conservative vs. surgical therapies. At this time a plantar fascial injection was recommended.  The patient agreed and a sterile  skin prep was applied.  An injection consisting of  dexamethasone and marcaine mixture was infiltrated at the point of maximal tenderness on the left  plantar fibroma.  Bandaid applied. The patient tolerated this well and was given instructions for aftercare.     Kristine Richmond, DPM

## 2023-07-22 ENCOUNTER — Encounter: Payer: Self-pay | Admitting: Neurology

## 2023-07-23 ENCOUNTER — Encounter: Payer: Self-pay | Admitting: Neurology

## 2023-07-24 ENCOUNTER — Other Ambulatory Visit: Payer: Self-pay | Admitting: Neurology

## 2023-08-01 ENCOUNTER — Ambulatory Visit
Admission: RE | Admit: 2023-08-01 | Discharge: 2023-08-01 | Disposition: A | Discharge status: Home / Self Care | Payer: 59 | Source: Ambulatory Visit | Attending: Neurology | Admitting: Neurology

## 2023-08-01 ENCOUNTER — Ambulatory Visit
Admission: RE | Admit: 2023-08-01 | Discharge: 2023-08-01 | Disposition: A | Discharge status: Home / Self Care | Payer: Self-pay | Source: Ambulatory Visit | Attending: Neurology | Admitting: Neurology

## 2023-08-01 DIAGNOSIS — R200 Anesthesia of skin: Secondary | ICD-10-CM

## 2023-08-01 DIAGNOSIS — G959 Disease of spinal cord, unspecified: Secondary | ICD-10-CM

## 2023-08-01 DIAGNOSIS — Z9889 Other specified postprocedural states: Secondary | ICD-10-CM

## 2023-08-01 DIAGNOSIS — R258 Other abnormal involuntary movements: Secondary | ICD-10-CM

## 2023-08-01 DIAGNOSIS — R251 Tremor, unspecified: Secondary | ICD-10-CM

## 2023-08-01 MED ORDER — GADOPICLENOL 0.5 MMOL/ML IV SOLN
10.0000 mL | Freq: Once | INTRAVENOUS | Status: AC | PRN
  Administered 2023-08-01: 10 mL via INTRAVENOUS

## 2023-08-05 ENCOUNTER — Telehealth: Payer: Self-pay | Admitting: Neurology

## 2023-08-05 NOTE — Telephone Encounter (Signed)
Pt called in and left a message with the access nurse. She wants her MRI results

## 2023-08-05 NOTE — Telephone Encounter (Signed)
Pt called an informed it takes about 2 weeks to get the results once we get the results we will call her

## 2023-08-22 ENCOUNTER — Encounter: Payer: Self-pay | Admitting: Neurology

## 2023-09-01 ENCOUNTER — Ambulatory Visit
Admission: RE | Admit: 2023-09-01 | Discharge: 2023-09-01 | Disposition: A | Discharge status: Home / Self Care | Payer: 59 | Source: Ambulatory Visit | Attending: Neurology

## 2023-09-01 DIAGNOSIS — G959 Disease of spinal cord, unspecified: Secondary | ICD-10-CM

## 2023-09-01 DIAGNOSIS — R200 Anesthesia of skin: Secondary | ICD-10-CM

## 2023-09-30 ENCOUNTER — Telehealth: Payer: Self-pay

## 2023-09-30 DIAGNOSIS — R202 Paresthesia of skin: Secondary | ICD-10-CM

## 2023-09-30 DIAGNOSIS — G959 Disease of spinal cord, unspecified: Secondary | ICD-10-CM

## 2023-09-30 DIAGNOSIS — R258 Other abnormal involuntary movements: Secondary | ICD-10-CM

## 2023-09-30 DIAGNOSIS — R251 Tremor, unspecified: Secondary | ICD-10-CM

## 2023-09-30 MED ORDER — GABAPENTIN 300 MG PO CAPS: 600.0000 mg | ORAL_CAPSULE | Freq: Two times a day (BID) | ORAL | 3 refills | Status: DC

## 2023-09-30 NOTE — Telephone Encounter (Signed)
 Please let pt know that since there are no abnormalities in the spine to explain her leg symptoms such as a pinched nerve, I suggest we complete imaging by looking at MRI brain wwo contrast. This will look for things such as stroke, tumor, or MS.    For her pain, how much gabapentin  is she taking?  If she is on gabapentin  300mg  twice daily, we can increase this to 300mg  three times daily.    We will reschedule her appointment to a sooner visit, once I have her MRI results.

## 2023-09-30 NOTE — Telephone Encounter (Signed)
 Called patient and informed her of Dr Anthony recommendations per below:   Please let pt know that since there are no abnormalities in the spine to explain her leg symptoms such as a pinched nerve, I suggest we complete imaging by looking at MRI brain wwo contrast. This will look for things such as stroke, tumor, or MS.     For her pain, how much gabapentin  is she taking?  If she is on gabapentin  300mg  twice daily, we can increase this to 300mg  three times daily.     We will reschedule her appointment to a sooner visit, once I have her MRI results.          Patient stated that she is taking Gabapentin  600 mg at night and 300 mg in the morning. Per Dr. Tobie she may take Gabapentin  600mg  twice a day. Patient is agreeable and is aware we will send in that new prescription for her with the increase.   Patient also would like to proceed with the MRI and is aware I will get that sent to [facility] and they will call her to schedule.   Patient thanked us  for the call and had no further questions or concerns.

## 2023-09-30 NOTE — Telephone Encounter (Signed)
 Spoke to patient and informed her Lumbar spine results per Dr. Leigh Can we let patient know her MRI lumbar spine showed no significant abnormalities (no nerves being pinched)? Thank you.  Patient verbalized understanding.  Patient stated that she has been having the same issues and nothing has improved and she cannot wait months again to see Dr. Tobie. Patient wants to know what she can do about her issues as they are not getting better. She does not want to wait until May to take care if this.   Informed patient that I will send Dr. Tobie this message and give her a call back today. Patient verbalized understanding.

## 2023-10-23 ENCOUNTER — Encounter: Payer: Self-pay | Admitting: Neurology

## 2023-10-27 ENCOUNTER — Ambulatory Visit
Admission: RE | Admit: 2023-10-27 | Discharge: 2023-10-27 | Disposition: A | Discharge status: Home / Self Care | Payer: 59 | Source: Ambulatory Visit | Attending: Neurology

## 2023-10-27 DIAGNOSIS — R202 Paresthesia of skin: Secondary | ICD-10-CM

## 2023-10-27 DIAGNOSIS — G959 Disease of spinal cord, unspecified: Secondary | ICD-10-CM

## 2023-10-27 DIAGNOSIS — R258 Other abnormal involuntary movements: Secondary | ICD-10-CM

## 2023-10-27 DIAGNOSIS — R251 Tremor, unspecified: Secondary | ICD-10-CM

## 2023-10-27 MED ORDER — GADOPICLENOL 0.5 MMOL/ML IV SOLN
10.0000 mL | Freq: Once | INTRAVENOUS | Status: AC | PRN
  Administered 2023-10-27: 10 mL via INTRAVENOUS

## 2023-11-11 ENCOUNTER — Ambulatory Visit (INDEPENDENT_AMBULATORY_CARE_PROVIDER_SITE_OTHER): Payer: 59 | Admitting: Neurology

## 2023-11-11 ENCOUNTER — Encounter: Payer: Self-pay | Admitting: Neurology

## 2023-11-11 VITALS — BP 109/72 | HR 95 | Ht 67.0 in | Wt 253.0 lb

## 2023-11-11 DIAGNOSIS — R258 Other abnormal involuntary movements: Secondary | ICD-10-CM | POA: Diagnosis not present

## 2023-11-11 DIAGNOSIS — R251 Tremor, unspecified: Secondary | ICD-10-CM | POA: Diagnosis not present

## 2023-11-11 DIAGNOSIS — R292 Abnormal reflex: Secondary | ICD-10-CM

## 2023-11-11 DIAGNOSIS — R29898 Other symptoms and signs involving the musculoskeletal system: Secondary | ICD-10-CM | POA: Diagnosis not present

## 2023-11-11 NOTE — Progress Notes (Signed)
Follow-up Visit   Date: 11/11/2023    Kristine Richmond MRN: [id number] DOB: 1955-11-14    Kristine Richmond is a 68 y.o. right-handed Caucasian female with history of thoracic schwannoma s/p resection (Dr. Wynetta Emery), secondary hypothyroidism s/p thyroidectomy for tumor returning to the clinic for follow-up of left hand tremor and leg tingling.  The patient was accompanied to the clinic by self.  IMPRESSION/PLAN: Left hand tremor with exam showing asymmetric hyperreflexia, rigidity, and bradykinesia.  Imaging of the neuroaxis does not show structural pathology.  We discussed the possibility of parkinson's variant, such as akinetic rigid syndrome, and will proceed with DAT scan to evaluate this.    Return to clinic after testing  --------------------------------------------- History of present illness: Starting in January 2024, she began having left foot plantar fasciitis for which she was doing PT.  During her PT, she began having left mid-thigh down into the lower leg and foot.   She also has weakness in the left foot with extension.  No falls in the past year.  She walks unassisted.  Her physical therapist noticed that she has left ankle clonus and reduced ROM with dorsiflexion, so recommended that she consider seeing neurology.  She also has left hand tremor which is new and feels that her strength and movements are lower on the left.  Her mother also has tremors of both hands.     She has constant numbness of the left 4th toe since back injury at the age of 32.  She also had history of thoracic schwannoma which was removed by Dr. Wynetta Emery in 2005.    UPDATE 11/11/2023:  She is here for follow-up to discuss imaging results, which did not show evidence of structural pathology.  Since she was last here, she feels that her feet are shuffling more when walking.  Movements in the left hand and leg requires more concentration.  Her movements are slower and she also feels that cognitively, its takes more effort  to do tasks.  Her hand writing has becomes smaller and not as neat.  She still has difficulty with extending the left foot.  When her left arm is placed in certain positions such as hyperextension at the wrist, she has noticed involuntary tremor, it resolved with repositioning.    Medications:  Current Outpatient Medications on File Prior to Visit  Medication Sig Dispense Refill   albuterol (VENTOLIN HFA) 108 (90 Base) MCG/ACT inhaler TAKE 2 PUFF(S) INHALED EVERY 6 HOURS, AS NEEDED     Bromfenac Sodium 0.09 % SOLN      fluticasone (FLONASE) 50 MCG/ACT nasal spray 2 sprays every morning for 30 days.     gabapentin (NEURONTIN) 300 MG capsule Take 2 capsules (600 mg total) by mouth 2 (two) times daily. 120 capsule 3   levothyroxine (SYNTHROID) 112 MCG tablet Take one tab po daily except take an additional 1/2 tab on mondays     liothyronine (CYTOMEL) 5 MCG tablet Take 1 tablet by mouth daily.     magnesium 30 MG tablet Take 30 mg by mouth 2 (two) times daily.     Turmeric (QC TUMERIC COMPLEX PO) Take by mouth. Take two tablets at bedtime     No current facility-administered medications on file prior to visit.    Allergies:  Allergies  Allergen Reactions   Amoxicillin-Pot Clavulanate Other (See Comments)   Morphine Nausea Only and Other (See Comments)   Moxifloxacin Other (See Comments) and Nausea Only    Dizziness  Dizziness  Dizziness    Vital Signs:  BP 109/72   Pulse 95   Ht 5\' 7"  (1.702 m)   Wt 253 lb (114.8 kg)   SpO2 99%   BMI 39.63 kg/m   Neurological Exam: MENTAL STATUS including orientation to time, place, person, recent and remote memory, attention span and concentration, language, and fund of knowledge is normal.  Speech is not dysarthric.  CRANIAL NERVES:  No visual field defects.  Pupils equal round and reactive to light.  Normal conjugate, extra-ocular eye movements in all directions of gaze.  No ptosis.  Face is symmetric. Palate elevates symmetrically.  Tongue  is midline.  Myerson's sign and palmomental sign is present bilaterally.   MOTOR:  Motor strength is 5/5 in all extremities.  There is mild left thumb, index finger, and middle finger tremor at rest, which resolves with hands outstretched.  Tone is increased in the left leg and foot.  Tone in the arms is normal.  MSRs:                                           Right        Left brachioradialis 2+  2+  biceps 2+  2+  triceps 2+  2+  patellar 3+  3+  ankle jerk 2+  2+  Hoffman no  yes  2-3 beat ankle clonus bilaterally.  SENSORY:  Intact to vibration throughout .  COORDINATION/GAIT:  Normal finger-to- nose-finger.  Finger tapping and heel tapping is slowed on the left.  Gait shows reduced arm swing on the left, smaller step, stable.  Data: MRI cervical spine wo contrast 08/12/2023: 1. No contrast-enhancing lesion is visualized. 2. Moderate left sided neuroforaminal narrowing at C3-C4, C4-C5, and C5-C6. 3. No evidence of high grade spinal canal stenosis.  MRI thoracic spine wo contrast 08/12/2023: 1. Normal MRI of the thoracic cord. 2. 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 This herniation has been present since at least 2012.   MRI lumbar spine wo contrast 09/14/2023: 1. Generally very mild for age lumbar spine degeneration, and capacious lumbar spinal canal. No spinal stenosis or convincing neural impingement. 2. Chronic L5-S1 disc and endplate degeneration, with mild left L5 neural foraminal stenosis, does not appear significantly changed since 2012.  MRI brain wwo contrast 11/06/2023: Essentially normal brain MRI with no acute intracranial pathology or other finding to explain the patient's symptoms.    Thank you for allowing me to participate in patient's care.  If I can answer any additional questions, I would be pleased to do so.    Sincerely,    Micai Apolinar K. Allena Katz, DO

## 2023-11-21 ENCOUNTER — Encounter (HOSPITAL_COMMUNITY)
Admission: RE | Admit: 2023-11-21 | Discharge: 2023-11-21 | Disposition: A | Discharge status: Home / Self Care | Payer: 59 | Source: Ambulatory Visit | Attending: Neurology | Admitting: Neurology

## 2023-11-21 DIAGNOSIS — R251 Tremor, unspecified: Secondary | ICD-10-CM | POA: Diagnosis present

## 2023-11-21 DIAGNOSIS — R258 Other abnormal involuntary movements: Secondary | ICD-10-CM | POA: Insufficient documentation

## 2023-11-21 DIAGNOSIS — R29898 Other symptoms and signs involving the musculoskeletal system: Secondary | ICD-10-CM | POA: Diagnosis present

## 2023-11-21 DIAGNOSIS — R292 Abnormal reflex: Secondary | ICD-10-CM | POA: Diagnosis present

## 2023-11-21 MED ORDER — POTASSIUM IODIDE (ANTIDOTE) 130 MG PO TABS
ORAL_TABLET | ORAL | Status: AC
  Filled 2023-11-21: qty 1

## 2023-11-21 MED ORDER — IOFLUPANE I 123 185 MBQ/2.5ML IV SOLN
4.4000 | Freq: Once | INTRAVENOUS | Status: AC | PRN
  Administered 2023-11-21: 4.4 via INTRAVENOUS

## 2023-11-24 ENCOUNTER — Telehealth: Payer: Self-pay | Admitting: Neurology

## 2023-11-24 NOTE — Telephone Encounter (Signed)
 Please let pt know that DAT scan is consistent with parkinson's disease.  Please offer follow-up visit on 2/26 at 10:50a to discuss the results and management.

## 2023-11-24 NOTE — Telephone Encounter (Signed)
 Pt called in stating she saw the DATscan results online and would like to talk with Dr. Allena Katz about them.

## 2023-11-24 NOTE — Telephone Encounter (Signed)
 Called patient and informed her of results and recommendations. Patient wanted an appointment later In the day and is aware someone from the front will give her a call to get her scheduled. Patient verbalized understanding and had no further questions or concerns.

## 2023-11-26 ENCOUNTER — Encounter: Payer: Self-pay | Admitting: Neurology

## 2023-11-26 ENCOUNTER — Ambulatory Visit (INDEPENDENT_AMBULATORY_CARE_PROVIDER_SITE_OTHER): Payer: 59 | Admitting: Neurology

## 2023-11-26 VITALS — BP 137/82 | HR 96 | Ht 67.0 in | Wt 251.0 lb

## 2023-11-26 DIAGNOSIS — G20A1 Parkinson's disease without dyskinesia, without mention of fluctuations: Secondary | ICD-10-CM

## 2023-11-26 NOTE — Patient Instructions (Addendum)
 Start Carbidopa Levodopa as follows at least 30-min prior to meals:   AM  Afternoon   Evening  Week 1:  1/2 tab  1/2 tab   1/2 tab Week 2:  1/2 tab  1/2 tab   1 tab Week 3:  1/2 tab  1 tab   1 tab Week 4:  1 tab  1 tab   1 tab  *Avoid taking with protein products, such as milk, meat, cheese  *if you develop nausea, take with crackers  Start physical therapy

## 2023-11-26 NOTE — Progress Notes (Signed)
 Follow-up Visit   Date: 11/26/2023    Kristine Richmond MRN: [id number] DOB: Dec 06, 1955    Kristine Richmond is a 68 y.o. right-handed Caucasian female with history of thoracic schwannoma s/p resection (Dr. Wynetta Emery), secondary hypothyroidism s/p thyroidectomy for tumor returning to the clinic for follow-up of left hand tremor and leg tingling.  The patient was accompanied to the clinic by self.   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  Diagnosis was confirmed by abnormal DAT scan.  Much of today's visit was spent discussing the diagnosis and management.   - Start sinemet 25/100 half tablet three times daily and titrate to 1 tablet three times daily.  - Start physical therapy - Parkinson's disease support group resources provided  Return to clinic in 4 months  --------------------------------------------- History of present illness: Starting in January 2024, she began having left foot plantar fasciitis for which she was doing PT.  During her PT, she began having left mid-thigh down into the lower leg and foot.   She also has weakness in the left foot with extension.  No falls in the past year.  She walks unassisted.  Her physical therapist noticed that she has left ankle clonus and reduced ROM with dorsiflexion, so recommended that she consider seeing neurology.  She also has left hand tremor which is new and feels that her strength and movements are lower on the left.  Her mother also has tremors of both hands.     She has constant numbness of the left 4th toe since back injury at the age of 78.  She also had history of thoracic schwannoma which was removed by Dr. Wynetta Emery in 2005.    UPDATE 11/11/2023:  She is here for follow-up to discuss imaging results, which did not show evidence of structural pathology.  Since she was last here, she feels that her feet are shuffling more when walking.  Movements in the  left hand and leg requires more concentration.  Her movements are slower and she also feels that cognitively, its takes more effort to do tasks.  Her hand writing has becomes smaller and not as neat.  She still has difficulty with extending the left foot.  When her left arm is placed in certain positions such as hyperextension at the wrist, she has noticed involuntary tremor, it resolved with repositioning.   UPDATE 11/26/2023:  She is here to discuss results of DAT scan which was positive.  There has been no significant change in her symptoms.  She continues to have slower movements on the left and gait is much more effortful.   Medications:  Current Outpatient Medications on File Prior to Visit  Medication Sig Dispense Refill   albuterol (VENTOLIN HFA) 108 (90 Base) MCG/ACT inhaler TAKE 2 PUFF(S) INHALED EVERY 6 HOURS, AS NEEDED     Bromfenac Sodium 0.09 % SOLN      fluticasone (FLONASE) 50 MCG/ACT nasal spray 2 sprays every morning for 30 days.     gabapentin (NEURONTIN) 300 MG capsule Take 2 capsules (600 mg total) by mouth 2 (two) times daily. (Patient taking differently: Take 600 mg by mouth 2 (two) times daily. prn) 120 capsule 3   levothyroxine (SYNTHROID) 112 MCG tablet Take one tab po daily except take an additional 1/2 tab on mondays     liothyronine (CYTOMEL) 5 MCG tablet Take 1 tablet by mouth daily.     magnesium 30 MG tablet Take 30  mg by mouth 2 (two) times daily.     Turmeric (QC TUMERIC COMPLEX PO) Take by mouth. Take two tablets at bedtime     No current facility-administered medications on file prior to visit.    Allergies:  Allergies  Allergen Reactions   Amoxicillin-Pot Clavulanate Other (See Comments)   Morphine Nausea Only and Other (See Comments)   Moxifloxacin Other (See Comments) and Nausea Only    Dizziness  Dizziness  Dizziness    Vital Signs:  BP 137/82   Pulse 96   Ht 5\' 7"  (1.702 m)   Wt 251 lb (113.9 kg)   SpO2 97%   BMI 39.31 kg/m    Neurological Exam: MENTAL STATUS including orientation to time, place, person, recent and remote memory, attention span and concentration, language, and fund of knowledge is normal.  Speech is not dysarthric.  CRANIAL NERVES:  Pupils equal round and reactive to light.  Normal conjugate, extra-ocular eye movements in all directions of gaze.  No ptosis.  Face is symmetric.   MOTOR:  Motor strength is 5/5 in all extremities.  There is mild left thumb, index finger, and middle finger tremor at rest, which resolves with hands outstretched.  Tone is increased in the left arm and leg with mild cogwheel in the arm.  Tone on the right side is normal.  MSRs:                                           Right        Left brachioradialis 2+  2+  biceps 2+  2+  triceps 2+  2+  patellar 3+  3+  ankle jerk 2+  2+   COORDINATION/GAIT:  Normal finger-to- nose-finger.  Finger tapping and heel tapping is slowed on the left.  Gait shows reduced arm swing on the left, smaller step, stable.  Data: MRI cervical spine wo contrast 08/12/2023: 1. No contrast-enhancing lesion is visualized. 2. Moderate left sided neuroforaminal narrowing at C3-C4, C4-C5, and C5-C6. 3. No evidence of high grade spinal canal stenosis.  MRI thoracic spine wo contrast 08/12/2023: 1. Normal MRI of the thoracic cord. 2. 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 This herniation has been present since at least 2012.   MRI lumbar spine wo contrast 09/14/2023: 1. Generally very mild for age lumbar spine degeneration, and capacious lumbar spinal canal. No spinal stenosis or convincing neural impingement. 2. Chronic L5-S1 disc and endplate degeneration, with mild left L5 neural foraminal stenosis, does not appear significantly changed since 2012.  MRI brain wwo contrast 11/06/2023: Essentially normal brain MRI with no acute intracranial pathology or other finding to explain the  patient's symptoms.   DAT scan 11/21/2023: Bilateral decreased striatal Ioflupane activity as above. This pattern can be seen in Parkinsonian syndromes.   Of note, DaTSCAN is not diagnostic of Parkinsonian syndromes, which remains a clinical diagnosis. DaTscan is an adjuvant test to aid in the clinical diagnosis of Parkinsonian syndromes.   Total time spent reviewing records, interview, history/exam, documentation, and coordination of care on day of encounter:  40 min    Thank you for allowing me to participate in patient's care.  If I can answer any additional questions, I would be pleased to do so.    Sincerely,    Hendry Speas K. Allena Katz, DO

## 2023-11-27 ENCOUNTER — Encounter: Payer: Self-pay | Admitting: Neurology

## 2023-11-28 MED ORDER — CARBIDOPA-LEVODOPA 25-100 MG PO TABS: 1.0000 | ORAL_TABLET | Freq: Three times a day (TID) | ORAL | 5 refills | Status: DC

## 2023-12-12 NOTE — Therapy (Signed)
 OUTPATIENT PHYSICAL THERAPY PARKINSON'S EVALUATION   Patient Name: Kristine Richmond MRN: [id number] DOB:06-Dec-1955, 68 y.o., female Today's Date: 12/16/2023   END OF SESSION:  PT End of Session - 12/16/23 1448     Visit Number 1    Date for PT Re-Evaluation 02/10/24    Authorization Type [employer]    PT Start Time 1448    PT Stop Time 1537    PT Time Calculation (min) 49 min    Activity Tolerance Patient tolerated treatment well    Behavior During Therapy WFL for tasks assessed/performed             Past Medical History:  Diagnosis Date   DDD (degenerative disc disease), cervical    DDD (degenerative disc disease), lumbar    L4-L5   Hypothyroidism    thyorid removed   Tumor    back   Past Surgical History:  Procedure Laterality Date   DILATION AND CURETTAGE OF UTERUS     3   THYROIDECTOMY     tumor removed Left    back   Patient Active Problem List   Diagnosis Date Noted   Chronic back pain     PCP:  No PCP  REFERRING PROVIDER: Glendale Chard, DO   REFERRING DIAG: G20.A1 (ICD-10-CM) - Parkinson's disease without dyskinesia or fluctuating manifestations (HCC)   THERAPY DIAG:  Other abnormalities of gait and mobility  Other abnormal involuntary movements  Muscle weakness (generalized)  RATIONALE FOR EVALUATION AND TREATMENT: Rehabilitation  ONSET DATE: Parkinson's diagnosis as of 11/26/2023; symptoms first noticed ~2 yrs ago  NEXT MD VISIT: 03/15/24   SUBJECTIVE:                                                                                                                                                                                                         SUBJECTIVE STATEMENT: Pt reports ~2 yrs ago she started feeling like she had forgotten how to walk. She then developed L plantar fasciitis for which she had PT last year - still having some symptoms.  She notes increased awareness of having to concentrate on lifting her L foot to walk.  She also has L  hand tremor.  She reports the neurologist is still working on titrating her PD meds.    Pt accompanied by: self  PAIN: Are you having pain? Yes: NPRS scale: 0/10, 3/10 when walking  Pain location: L heel/plantar fascia Pain description: stabbing  Aggravating factors: walking  Relieving factors: rest   PERTINENT HISTORY:  Thoracic schwannoma s/p resection 2005 (Dr. Wynetta Emery),  secondary hypothyroidism s/p thyroidectomy for tumor, chronic back pain, cervical and lumbar DDD, L LE N/T mid-thigh down into the lower leg and foot starting ~06/2023, constant numbness of the left 4th toe since back injury at the age of 83, L plantar fasciitis, L sided weakness, L hand tremor  PRECAUTIONS: None  RED FLAGS: None  WEIGHT BEARING RESTRICTIONS: No  FALLS:  Has patient fallen in last 6 months? No  LIVING ENVIRONMENT: Lives with: lives with their spouse Lives in: House/apartment Stairs: Yes: Internal: 14 steps; on right going up and External: 2 or 3 steps; none and R rail going up 3 front steps, no rail in garage (2 steps) Has following equipment at home: None  OCCUPATION: FT - mostly deskwork (change management), occasional travel  PLOF: Independent and Leisure: painting, drawing, sewing; sit to stand for exercises, rowing machine (not typically used), stationary bike w/o tension  PATIENT GOALS: "To be able to walk normal."   OBJECTIVE: (objective measures completed at initial evaluation unless otherwise dated)  DIAGNOSTIC FINDINGS:  11/21/23 - NM Brain DATSCAN FINDINGS: Absent radiotracer activity LEFT and RIGHT putamen. Decreased radiotracer activity in head of the RIGHT caudate nucleus compared to the LEFT.   IMPRESSION: Bilateral decreased striatal Ioflupane activity as above. This pattern can be seen in Parkinsonian syndromes.   Of note, DaTSCAN is not diagnostic of Parkinsonian syndromes, which remains a clinical diagnosis. DaTscan is an adjuvant test to aid in the clinical diagnosis  of Parkinsonian syndromes.  10/27/23 - MR Brain IMPRESSION: Essentially normal brain MRI with no acute intracranial pathology or other finding to explain the patient's symptoms.  09/01/23 - MR Lumbar spine IMPRESSION: 1. Generally very mild for age lumbar spine degeneration, and capacious lumbar spinal canal. No spinal stenosis or convincing neural impingement. 2. Chronic L5-S1 disc and endplate degeneration, with mild left L5 neural foraminal stenosis, does not appear significantly changed since 2012.  08/01/23 - MR Thoracic spine IMPRESSION: 1. Normal MRI of the thoracic cord. 2. 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 This herniation has been present since at least 2012.  08/01/23 - MR Cervical spine IMPRESSION: 1. No contrast-enhancing lesion is visualized. 2. Moderate left sided neuroforaminal narrowing at C3-C4, C4-C5, and C5-C6. 3. No evidence of high grade spinal canal stenosis.  COGNITION: Overall cognitive status: Impaired and pt noting more issues with memory - requiring notes where she used to be able to do presentations w/o notes   SENSATION: Constant tingling in L UE and LE  COORDINATION: Heel-toe WFL Heel to shin mildly impaired on L  POSTURE:  rounded shoulders, forward head, and flexed trunk   MUSCLE LENGTH: Hamstrings: mild tight R, mod tight L ITB: mild tight B Piriformis: mild tight B Hip flexors: mod tight R>L Quads: mild tight B Heelcord: mild/mod tight L>R  LOWER EXTREMITY ROM:    Grossly WFL except L ankle DF Active  Right eval Left eval  Ankle dorsiflexion 18 8  Ankle plantarflexion 48 46  Ankle inversion    Ankle eversion     (Blank rows = not tested)  LOWER EXTREMITY MMT:    MMT Right eval Left eval  Hip flexion 4 4  Hip extension 4 4  Hip abduction 3+ 3+  Hip adduction 4 4  Hip internal rotation 4+ 4+  Hip external rotation 4- 4-  Knee flexion 5 5  Knee extension 5 5  Ankle  dorsiflexion 5 4+  Ankle plantarflexion    Ankle  inversion    Ankle eversion    (Blank rows = not tested)  BED MOBILITY:  Independent but pt notes more difficulty than she used to have with rolling over  TRANSFERS: Assistive device utilized: None  Sit to stand: Complete Independence Stand to sit: Complete Independence Chair to chair: Complete Independence Floor:  NT  GAIT: Distance walked: clinic distances Assistive device utilized: None Level of assistance: Complete Independence Gait pattern: decreased arm swing- Right and decreased arm swing- Left Comments:   FUNCTIONAL TESTS:  5 times sit to stand: 11.96 sec Timed up and go (TUG): Normal = 5.82 sec; Manual = 6.22 sec; Cognitive = 5.59 sec  10 meter walk test: 8.78 sec; Gait speed = 3.74 ft/sec  Functional gait assessment: TBA  PATIENT SURVEYS:  ABC scale 1540 / 1600 = 96.3 %   TODAY'S TREATMENT:   12/16/2023  SELF CARE:  Reviewed eval findings and role of PT in addressing identified deficits as well as overview of PWR! Moves.    PATIENT EDUCATION:  Education details: PT eval findings, anticipated POC, need for further assessment of dynamic balance with gait (FGA), and overview of PWR! Moves   Person educated: Patient Education method: Explanation Education comprehension: verbalized understanding  HOME EXERCISE PROGRAM: TBD   ASSESSMENT:  CLINICAL IMPRESSION: Kristine Richmond is a 68 y.o. female who was referred to physical therapy for evaluation and treatment for Parkinson's disease.  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  Patient was diagnosed with Parkinson's last month (diagnosis confirmed by abnormal DAT scan) but notes initial awareness of symptoms as of ~2 yrs ago.  Her main symptoms and impairments include awareness of increased effort to move L arm and leg especially advancing foot while walking - better since starting  Sinemet, and altered sensation on L side.  Patient presents with physical impairments of decreased timing and coordination of gait, impaired ambulation, abnormal posture, bradykinesia with transfers, impaired activity tolerance, LE weakness, postural instability and decreased safety awareness impacting safe and independent functional mobility.  Examination revealed basic screening for PD WNL as indicated by the following objective test measures: 5xSTS of 11.96 sec (>15 sec indicates increased risk for falls and decreased BLE power), Gait speed 3.74 ft/sec, (2.62 ft/sec is needed for community access and 4.37 ft/sec is considered normal walking speed), TUG of 5.82 sec (>13.5 sec indicates increased risk for falls), TUG Manual of 6.22 sec (difference between TUG manual and TUG >4.5 seconds indicates increased fall risk), and TUG cognitive of 5.59 sec (>/= 15 seconds indicates high risk for falls and community dwelling older adults).  She will benefit from further evaluation of dynamic gait with FGA to determine if fall risk is a concern.  Kristine Richmond will benefit from skilled PT to address above deficits to improve mobility and activity tolerance to help reach the maximal level of functional independence and mobility while providing training in a HEP to help delay progression of Parkinson's symptoms.  Patient demonstrates understanding of this POC and is in agreement with this plan.   OBJECTIVE IMPAIRMENTS: Abnormal gait, decreased activity tolerance, decreased coordination, difficulty walking, decreased strength, impaired perceived functional ability, impaired flexibility, impaired sensation, improper body mechanics, postural dysfunction, and pain.   ACTIVITY LIMITATIONS: sleeping, stairs, transfers, bed mobility, and locomotion level  PARTICIPATION LIMITATIONS: community activity, occupation, and travel  PERSONAL FACTORS: Past/current experiences, Time since onset of injury/illness/exacerbation, and 3+  comorbidities: Thoracic schwannoma s/p resection 2005 (Dr. Wynetta Emery), secondary  hypothyroidism s/p thyroidectomy for tumor, chronic back pain, cervical and lumbar DDD, L LE N/T mid-thigh down into the lower leg and foot starting ~06/2023, constant numbness of the left 4th toe since back injury at the age of 25, L plantar fasciitis, L sided weakness, L hand tremor  are also affecting patient's functional outcome.   REHAB POTENTIAL: Excellent  CLINICAL DECISION MAKING: Evolving/moderate complexity  EVALUATION COMPLEXITY: Moderate   GOALS: Goals reviewed with patient? Yes  SHORT TERM GOALS: Target date: 01/13/2024  Patient will be independent with initial HEP. Baseline:  Goal status: INITIAL  2.  Patient will be educated on strategies to decrease risk of falls.  Baseline:  Goal status: INITIAL  LONG TERM GOALS: Target date: 02/10/2024  Patient will be independent with ongoing/advanced HEP for self-management at home incorporating PWR! Moves as indicated.  Baseline:  Goal status: INITIAL  2.  Patient will be able to ambulate 600' w/o AD over variable surfaces with good safety to access community.  Baseline:  Goal status: INITIAL  3.  Patient will be able to ascend/descend stairs safely with or w/o single HR for safety with household and community ambulation.  Baseline:  Goal status: INITIAL   4.  Patient will demonstrate gait speed of >/= 4.37 ft/sec (1.33 m/s) to demonstrate normal walking speed.  Baseline: 3.74 ft/sec Goal status: INITIAL  5.  Patient will demonstrate at least 4 point improvement on FGA to improve gait stability and reduce risk for falls. (MCID = 4 points). Baseline: TBD Goal status: INITIAL  6.  Patient will verbalize understanding of local Parkinson's disease community resources, including community fitness post d/c. Baseline:  Goal status: INITIAL   PLAN:  PT FREQUENCY: 2x/week  PT DURATION: 8 weeks  PLANNED INTERVENTIONS: 97164- PT Re-evaluation,  97110-Therapeutic exercises, 97530- Therapeutic activity, [id number]- Neuromuscular re-education, 97535- Self Care, [id number]- Manual therapy, 97116- Gait training, [phone]- Ultrasound, [phone]- Ionotophoresis 4mg /ml Dexamethasone, Patient/Family education, Balance training, Stair training, Taping, Dry Needling, Cryotherapy, Moist heat, and 97750- Physical Performance Test or Measurement  PLAN FOR NEXT SESSION: Complete FGA; create initial HEP for proximal LE flexibility and hip strengthening; introduce PWR! Moves   [place], [place] 12/16/2023, 4:16 PM

## 2023-12-16 ENCOUNTER — Ambulatory Visit: Payer: 59 | Attending: Neurology | Admitting: Physical Therapy

## 2023-12-16 ENCOUNTER — Other Ambulatory Visit: Payer: Self-pay

## 2023-12-16 DIAGNOSIS — G20A1 Parkinson's disease without dyskinesia, without mention of fluctuations: Secondary | ICD-10-CM | POA: Diagnosis not present

## 2023-12-16 DIAGNOSIS — M6281 Muscle weakness (generalized): Secondary | ICD-10-CM | POA: Insufficient documentation

## 2023-12-16 DIAGNOSIS — R2689 Other abnormalities of gait and mobility: Secondary | ICD-10-CM | POA: Diagnosis present

## 2023-12-16 DIAGNOSIS — R258 Other abnormal involuntary movements: Secondary | ICD-10-CM | POA: Diagnosis present

## 2023-12-22 ENCOUNTER — Encounter: Payer: Self-pay | Admitting: Physical Therapy

## 2023-12-22 ENCOUNTER — Ambulatory Visit: Admitting: Physical Therapy

## 2023-12-22 DIAGNOSIS — R2689 Other abnormalities of gait and mobility: Secondary | ICD-10-CM | POA: Diagnosis not present

## 2023-12-22 DIAGNOSIS — M6281 Muscle weakness (generalized): Secondary | ICD-10-CM

## 2023-12-22 DIAGNOSIS — R258 Other abnormal involuntary movements: Secondary | ICD-10-CM

## 2023-12-22 NOTE — Therapy (Signed)
 OUTPATIENT PHYSICAL THERAPY TREATMENT   Patient Name: Kristine Richmond MRN: [id number] DOB:04/09/1956, 68 y.o., female Today's Date: 12/22/2023   END OF SESSION:  PT End of Session - 12/22/23 1317     Visit Number 2    Date for PT Re-Evaluation 02/10/24    Authorization Type [employer]    PT Start Time 1317    PT Stop Time 1406    PT Time Calculation (min) 49 min    Activity Tolerance Patient tolerated treatment well    Behavior During Therapy WFL for tasks assessed/performed              Past Medical History:  Diagnosis Date   DDD (degenerative disc disease), cervical    DDD (degenerative disc disease), lumbar    L4-L5   Hypothyroidism    thyorid removed   Tumor    back   Past Surgical History:  Procedure Laterality Date   DILATION AND CURETTAGE OF UTERUS     3   THYROIDECTOMY     tumor removed Left    back   Patient Active Problem List   Diagnosis Date Noted   Chronic back pain     PCP:  No PCP  REFERRING PROVIDER: Glendale Chard, DO   REFERRING DIAG: G20.A1 (ICD-10-CM) - Parkinson's disease without dyskinesia or fluctuating manifestations (HCC)   THERAPY DIAG:  Other abnormalities of gait and mobility  Other abnormal involuntary movements  Muscle weakness (generalized)  RATIONALE FOR EVALUATION AND TREATMENT: Rehabilitation  ONSET DATE: Parkinson's diagnosis as of 11/26/2023; symptoms first noticed ~2 yrs ago  NEXT MD VISIT: 03/15/24   SUBJECTIVE:                                                                                                                                                                                                         SUBJECTIVE STATEMENT: Pt reports her plantar fascia pain has been better than it was 4 months ago, esp over the past few weeks.    Pt accompanied by: self  PAIN: Are you having pain? Yes: NPRS scale: 1-2/10 when walking  Pain location: L heel/plantar fascia Pain description: stabbing  Aggravating factors:  walking  Relieving factors: rest   PERTINENT HISTORY:  Thoracic schwannoma s/p resection 2005 (Dr. Wynetta Emery), secondary hypothyroidism s/p thyroidectomy for tumor, chronic back pain, cervical and lumbar DDD, L LE N/T mid-thigh down into the lower leg and foot starting ~06/2023, constant numbness of the left 4th toe since back injury at the age of 63, L plantar fasciitis, L sided weakness, L hand  tremor  PRECAUTIONS: None  RED FLAGS: None  WEIGHT BEARING RESTRICTIONS: No  FALLS:  Has patient fallen in last 6 months? No  LIVING ENVIRONMENT: Lives with: lives with their spouse Lives in: House/apartment Stairs: Yes: Internal: 14 steps; on right going up and External: 2 or 3 steps; none and R rail going up 3 front steps, no rail in garage (2 steps) Has following equipment at home: None  OCCUPATION: FT - mostly deskwork (change management), occasional travel  PLOF: Independent and Leisure: painting, drawing, sewing; sit to stand for exercises, rowing machine (not typically used), stationary bike w/o tension  PATIENT GOALS: "To be able to walk normal."   OBJECTIVE: (objective measures completed at initial evaluation unless otherwise dated)  DIAGNOSTIC FINDINGS:  11/21/23 - NM Brain DATSCAN FINDINGS: Absent radiotracer activity LEFT and RIGHT putamen. Decreased radiotracer activity in head of the RIGHT caudate nucleus compared to the LEFT.   IMPRESSION: Bilateral decreased striatal Ioflupane activity as above. This pattern can be seen in Parkinsonian syndromes.   Of note, DaTSCAN is not diagnostic of Parkinsonian syndromes, which remains a clinical diagnosis. DaTscan is an adjuvant test to aid in the clinical diagnosis of Parkinsonian syndromes.  10/27/23 - MR Brain IMPRESSION: Essentially normal brain MRI with no acute intracranial pathology or other finding to explain the patient's symptoms.  09/01/23 - MR Lumbar spine IMPRESSION: 1. Generally very mild for age lumbar spine  degeneration, and capacious lumbar spinal canal. No spinal stenosis or convincing neural impingement. 2. Chronic L5-S1 disc and endplate degeneration, with mild left L5 neural foraminal stenosis, does not appear significantly changed since 2012.  08/01/23 - MR Thoracic spine IMPRESSION: 1. Normal MRI of the thoracic cord. 2. 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 This herniation has been present since at least 2012.  08/01/23 - MR Cervical spine IMPRESSION: 1. No contrast-enhancing lesion is visualized. 2. Moderate left sided neuroforaminal narrowing at C3-C4, C4-C5, and C5-C6. 3. No evidence of high grade spinal canal stenosis.  COGNITION: Overall cognitive status: Impaired and pt noting more issues with memory - requiring notes where she used to be able to do presentations w/o notes   SENSATION: Constant tingling in L UE and LE  COORDINATION: Heel-toe WFL Heel to shin mildly impaired on L  POSTURE:  rounded shoulders, forward head, and flexed trunk   MUSCLE LENGTH: Hamstrings: mild tight R, mod tight L ITB: mild tight B Piriformis: mild tight B Hip flexors: mod tight R>L Quads: mild tight B Heelcord: mild/mod tight L>R  LOWER EXTREMITY ROM:    Grossly WFL except L ankle DF Active  Right eval Left eval  Ankle dorsiflexion 18 8  Ankle plantarflexion 48 46  Ankle inversion    Ankle eversion     (Blank rows = not tested)  LOWER EXTREMITY MMT:    MMT Right eval Left eval  Hip flexion 4 4  Hip extension 4 4  Hip abduction 3+ 3+  Hip adduction 4 4  Hip internal rotation 4+ 4+  Hip external rotation 4- 4-  Knee flexion 5 5  Knee extension 5 5  Ankle dorsiflexion 5 4+  Ankle plantarflexion    Ankle inversion    Ankle eversion    (Blank rows = not tested)  BED MOBILITY:  Independent but pt notes more difficulty than she used to have with rolling over  TRANSFERS: Assistive device utilized: None  Sit to stand:  Complete Independence Stand to sit: Complete Independence  Chair to chair: Complete Independence Floor:  NT  GAIT: Distance walked: clinic distances Assistive device utilized: None Level of assistance: Complete Independence Gait pattern: decreased arm swing- Right and decreased arm swing- Left Comments:   FUNCTIONAL TESTS:  5 times sit to stand: 11.96 sec Timed up and go (TUG): Normal = 5.82 sec; Manual = 6.22 sec; Cognitive = 5.59 sec  10 meter walk test: 8.78 sec; Gait speed = 3.74 ft/sec  Functional gait assessment: 26/30, 25-28 indicates a low risk fall (12/22/23)    PATIENT SURVEYS:  ABC scale 1540 / 1600 = 96.3 %   TODAY'S TREATMENT:   12/22/2023  PHYSICAL PERFORMANCE TEST or MEASUREMENT: FGA = 26/30, 25-28 indicates a low risk fall  Functional Gait  Assessment  Gait Level Surface Walks 20 ft in less than 7 sec but greater than 5.5 sec, uses assistive device, slower speed, mild gait deviations, or deviates 6-10 in outside of the 12 in walkway width.   Change in Gait Speed Able to change speed, demonstrates mild gait deviations, deviates 6-10 in outside of the 12 in walkway width, or no gait deviations, unable to achieve a major change in velocity, or uses a change in velocity, or uses an assistive device.   Gait with Horizontal Head Turns Performs head turns smoothly with no change in gait. Deviates no more than 6 in outside 12 in walkway width   Gait with Vertical Head Turns Performs head turns with no change in gait. Deviates no more than 6 in outside 12 in walkway width.   Gait and Pivot Turn Pivot turns safely within 3 sec and stops quickly with no loss of balance.   Step Over Obstacle Is able to step over 2 stacked shoe boxes taped together (9 in total height) without changing gait speed. No evidence of imbalance.   Gait with Narrow Base of Support Ambulates 4-7 steps.   Gait with Eyes Closed Walks 20 ft, no assistive devices, good speed, no evidence of imbalance, normal  gait pattern, deviates no more than 6 in outside 12 in walkway width. Ambulates 20 ft in less than 7 sec.   Ambulating Backwards Walks 20 ft, no assistive devices, good speed, no evidence for imbalance, normal gait   Steps Alternating feet, no rail.   Total Score 26   FGA comment: 25-28 = low risk fall      THERAPEUTIC EXERCISE: To improve ROM and flexibility.  Demonstration, verbal and tactile cues throughout for technique. Supine B HS with strap 2 x 30" each, 2nd rep with dorsiflexion to increase calf stretch (too intense) R Mod Thomas quad/hip flexor stretch with strap x 30" Seated hip hinge HS stretch x 30" - better tolerated than supine Side-sitting lunge position hip flexor stretch over edge of chair x 30" Standing lunge position hip flexor stretch x 30" - better tolerated than supine or sitting Hooklying LTR 10 x 5" S/L open book stretch 10 x 5" bil  NEUROMUSCULAR RE-EDUCATION: To improve coordination, kinesthesia, posture, amplitude of movement, speed of movement to reduce bradykinesia, and reduce rigidity.  PWR! Moves in sitting: PWR! Up x 10 PWR! Rock x 3 - limited secondary to R hip discomfort PWR! Twist x 5 - limited secondary to low/mid back tightness at level of prior schwannoma resection PWR! Step deferred due to limited tolerance for previous movement patterns   12/16/2023 - Eval SELF CARE:  Reviewed eval findings and role of PT in addressing identified deficits as well as overview of PWR! Moves.  PATIENT EDUCATION:  Education details: PT eval findings, initial HEP, and overview of PWR! Moves   Person educated: Patient Education method: Explanation, Demonstration, Verbal cues, and Handouts Education comprehension: verbalized understanding, returned demonstration, verbal cues required, and needs further education  HOME EXERCISE PROGRAM: Access Code: [email or web address] URL: [email or web address] Date: 12/22/2023 Prepared by: Glenetta Hew  Exercises - Seated  Hamstring Stretch  - 2 x daily - 7 x weekly - 3 reps - 30 sec hold - Standing Hip Flexor Stretch  - 2 x daily - 7 x weekly - 3 reps - 30 sec hold - Seated Table Piriformis Stretch  - 2 x daily - 7 x weekly - 3 reps - 30 sec hold - Supine Lower Trunk Rotation  - 2 x daily - 7 x weekly - 5 reps - 10 sec hold - Sidelying Thoracic Rotation with Open Book  - 2 x daily - 7 x weekly - 10 reps - 5 sec hold   ASSESSMENT:  CLINICAL IMPRESSION: FGA completed with score of 26/30 indicating a low risk for falls.  Initial HEP instruction provided for LE flexibility and stretching based on muscle length assessment findings.  Patient demonstrating preference for seated or standing stretches versus supine.  Attempted to initiate PWR! Moves however patient noting limitation with PWR! Rock due to R hip pain and PWR! Twist due to tightness/restriction at site of prior thoracic schwannoma resection, therefore switched back to stretches adding trunk rotation to HEP.  Will assess response to initial HEP and attempt to resume training in PWR! Moves next session.  Adam will benefit from continued skilled PT to address ongoing deficits to improve mobility and activity tolerance to help reach the maximal level of functional independence and mobility while providing training in a HEP to help delay progression of Parkinson's symptoms.    OBJECTIVE IMPAIRMENTS: Abnormal gait, decreased activity tolerance, decreased coordination, difficulty walking, decreased strength, impaired perceived functional ability, impaired flexibility, impaired sensation, improper body mechanics, postural dysfunction, and pain.   ACTIVITY LIMITATIONS: sleeping, stairs, transfers, bed mobility, and locomotion level  PARTICIPATION LIMITATIONS: community activity, occupation, and travel  PERSONAL FACTORS: Past/current experiences, Time since onset of injury/illness/exacerbation, and 3+ comorbidities: Thoracic schwannoma s/p resection 2005 (Dr. Wynetta Emery),  secondary hypothyroidism s/p thyroidectomy for tumor, chronic back pain, cervical and lumbar DDD, L LE N/T mid-thigh down into the lower leg and foot starting ~06/2023, constant numbness of the left 4th toe since back injury at the age of 84, L plantar fasciitis, L sided weakness, L hand tremor  are also affecting patient's functional outcome.   REHAB POTENTIAL: Excellent  CLINICAL DECISION MAKING: Evolving/moderate complexity  EVALUATION COMPLEXITY: Moderate   GOALS: Goals reviewed with patient? Yes  SHORT TERM GOALS: Target date: 01/13/2024  Patient will be independent with initial HEP. Baseline:  Goal status: IN PROGRESS  2.  Patient will be educated on strategies to decrease risk of falls.  Baseline:  Goal status: IN PROGRESS  LONG TERM GOALS: Target date: 02/10/2024  Patient will be independent with ongoing/advanced HEP for self-management at home incorporating PWR! Moves as indicated.  Baseline:  Goal status: IN PROGRESS  2.  Patient will be able to ambulate 600' w/o AD over variable surfaces with good safety to access community.  Baseline:  Goal status: IN PROGRESS  3.  Patient will be able to ascend/descend stairs safely with or w/o single HR for safety with household and community ambulation.  Baseline:  Goal status: IN PROGRESS   4.  Patient will demonstrate  gait speed of >/= 4.37 ft/sec (1.33 m/s) to demonstrate normal walking speed.  Baseline: 3.74 ft/sec Goal status: IN PROGRESS  5.  Patient will demonstrate at least 4 point improvement on FGA to improve gait stability and reduce risk for falls. (MCID = 4 points). Baseline: 26/30 (12/22/23) Goal status: IN PROGRESS  6.  Patient will verbalize understanding of local Parkinson's disease community resources, including community fitness post d/c. Baseline:  Goal status: IN PROGRESS   PLAN:  PT FREQUENCY: 2x/week  PT DURATION: 8 weeks  PLANNED INTERVENTIONS: 97164- PT Re-evaluation, 97110-Therapeutic  exercises, 97530- Therapeutic activity, 97112- Neuromuscular re-education, 97535- Self Care, 54098- Manual therapy, [phone]- Gait training, [phone]- Ultrasound, [phone]- Ionotophoresis 4mg /ml Dexamethasone, Patient/Family education, Balance training, Stair training, Taping, Dry Needling, Cryotherapy, Moist heat, and 97750- Physical Performance Test or Measurement  PLAN FOR NEXT SESSION: Review initial HEP for proximal LE/core flexibility and add hip strengthening; continue to attempt to introduce PWR! Moves as tolerated   Marry Guan, PT 12/22/2023, 7:56 PM

## 2023-12-30 ENCOUNTER — Ambulatory Visit: Attending: Neurology | Admitting: Physical Therapy

## 2023-12-30 DIAGNOSIS — R258 Other abnormal involuntary movements: Secondary | ICD-10-CM | POA: Insufficient documentation

## 2023-12-30 DIAGNOSIS — R2689 Other abnormalities of gait and mobility: Secondary | ICD-10-CM | POA: Insufficient documentation

## 2023-12-30 DIAGNOSIS — M6281 Muscle weakness (generalized): Secondary | ICD-10-CM | POA: Diagnosis present

## 2023-12-30 DIAGNOSIS — R252 Cramp and spasm: Secondary | ICD-10-CM | POA: Insufficient documentation

## 2023-12-30 DIAGNOSIS — M79672 Pain in left foot: Secondary | ICD-10-CM | POA: Insufficient documentation

## 2023-12-30 NOTE — Therapy (Signed)
 OUTPATIENT PHYSICAL THERAPY TREATMENT   Patient Name: Kristine Richmond MRN: [id number] DOB:08-07-1956, 68 y.o., female Today's Date: 12/30/2023   END OF SESSION:  PT End of Session - 12/30/23 1011     Visit Number 3    Date for PT Re-Evaluation 02/10/24    Authorization Type [employer]    PT Start Time 0932    PT Stop Time 1012    PT Time Calculation (min) 40 min    Activity Tolerance Patient tolerated treatment well    Behavior During Therapy WFL for tasks assessed/performed               Past Medical History:  Diagnosis Date   DDD (degenerative disc disease), cervical    DDD (degenerative disc disease), lumbar    L4-L5   Hypothyroidism    thyorid removed   Tumor    back   Past Surgical History:  Procedure Laterality Date   DILATION AND CURETTAGE OF UTERUS     3   THYROIDECTOMY     tumor removed Left    back   Patient Active Problem List   Diagnosis Date Noted   Chronic back pain     PCP:  No PCP  REFERRING PROVIDER: Glendale Chard, DO   REFERRING DIAG: G20.A1 (ICD-10-CM) - Parkinson's disease without dyskinesia or fluctuating manifestations (HCC)   THERAPY DIAG:  Other abnormalities of gait and mobility  Other abnormal involuntary movements  Muscle weakness (generalized)  Pain in left foot  Cramp and spasm  RATIONALE FOR EVALUATION AND TREATMENT: Rehabilitation  ONSET DATE: Parkinson's diagnosis as of 11/26/2023; symptoms first noticed ~2 yrs ago  NEXT MD VISIT: 03/15/24   SUBJECTIVE:                                                                                                                                                                                                         SUBJECTIVE STATEMENT: Kristine Richmond reports that she doesn't have much pain aside from her L heel pain. She says that she can roll over in bed easier.  Pt accompanied by: self  PAIN: Are you having pain? Yes: NPRS scale: 2/10 when walking  Pain location: L heel/plantar  fascia Pain description: stabbing  Aggravating factors: walking  Relieving factors: rest   PERTINENT HISTORY:  Thoracic schwannoma s/p resection 2005 (Dr. Wynetta Emery), secondary hypothyroidism s/p thyroidectomy for tumor, chronic back pain, cervical and lumbar DDD, L LE N/T mid-thigh down into the lower leg and foot starting ~06/2023, constant numbness of the left 4th toe since back injury at the  age of 71, L plantar fasciitis, L sided weakness, L hand tremor  PRECAUTIONS: None  RED FLAGS: None  WEIGHT BEARING RESTRICTIONS: No  FALLS:  Has patient fallen in last 6 months? No  LIVING ENVIRONMENT: Lives with: lives with their spouse Lives in: House/apartment Stairs: Yes: Internal: 14 steps; on right going up and External: 2 or 3 steps; none and R rail going up 3 front steps, no rail in garage (2 steps) Has following equipment at home: None  OCCUPATION: FT - mostly deskwork (change management), occasional travel  PLOF: Independent and Leisure: painting, drawing, sewing; sit to stand for exercises, rowing machine (not typically used), stationary bike w/o tension  PATIENT GOALS: "To be able to walk normal."   OBJECTIVE: (objective measures completed at initial evaluation unless otherwise dated)  DIAGNOSTIC FINDINGS:  11/21/23 - NM Brain DATSCAN FINDINGS: Absent radiotracer activity LEFT and RIGHT putamen. Decreased radiotracer activity in head of the RIGHT caudate nucleus compared to the LEFT.   IMPRESSION: Bilateral decreased striatal Ioflupane activity as above. This pattern can be seen in Parkinsonian syndromes.   Of note, DaTSCAN is not diagnostic of Parkinsonian syndromes, which remains a clinical diagnosis. DaTscan is an adjuvant test to aid in the clinical diagnosis of Parkinsonian syndromes.  10/27/23 - MR Brain IMPRESSION: Essentially normal brain MRI with no acute intracranial pathology or other finding to explain the patient's symptoms.  09/01/23 - MR Lumbar  spine IMPRESSION: 1. Generally very mild for age lumbar spine degeneration, and capacious lumbar spinal canal. No spinal stenosis or convincing neural impingement. 2. Chronic L5-S1 disc and endplate degeneration, with mild left L5 neural foraminal stenosis, does not appear significantly changed since 2012.  08/01/23 - MR Thoracic spine IMPRESSION: 1. Normal MRI of the thoracic cord. 2. 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 This herniation has been present since at least 2012.  08/01/23 - MR Cervical spine IMPRESSION: 1. No contrast-enhancing lesion is visualized. 2. Moderate left sided neuroforaminal narrowing at C3-C4, C4-C5, and C5-C6. 3. No evidence of high grade spinal canal stenosis.  COGNITION: Overall cognitive status: Impaired and pt noting more issues with memory - requiring notes where she used to be able to do presentations w/o notes   SENSATION: Constant tingling in L UE and LE  COORDINATION: Heel-toe WFL Heel to shin mildly impaired on L  POSTURE:  rounded shoulders, forward head, and flexed trunk   MUSCLE LENGTH: Hamstrings: mild tight R, mod tight L ITB: mild tight B Piriformis: mild tight B Hip flexors: mod tight R>L Quads: mild tight B Heelcord: mild/mod tight L>R  LOWER EXTREMITY ROM:    Grossly WFL except L ankle DF Active  Right eval Left eval  Ankle dorsiflexion 18 8  Ankle plantarflexion 48 46  Ankle inversion    Ankle eversion     (Blank rows = not tested)  LOWER EXTREMITY MMT:    MMT Right eval Left eval  Hip flexion 4 4  Hip extension 4 4  Hip abduction 3+ 3+  Hip adduction 4 4  Hip internal rotation 4+ 4+  Hip external rotation 4- 4-  Knee flexion 5 5  Knee extension 5 5  Ankle dorsiflexion 5 4+  Ankle plantarflexion    Ankle inversion    Ankle eversion    (Blank rows = not tested)  BED MOBILITY:  Independent but pt notes more difficulty than she used to have with rolling  over  TRANSFERS: Assistive device utilized: None  Sit to stand: Complete Independence Stand to sit: Complete Independence Chair to chair: Complete Independence Floor:  NT  GAIT: Distance walked: clinic distances Assistive device utilized: None Level of assistance: Complete Independence Gait pattern: decreased arm swing- Right and decreased arm swing- Left Comments:   FUNCTIONAL TESTS:  5 times sit to stand: 11.96 sec Timed up and go (TUG): Normal = 5.82 sec; Manual = 6.22 sec; Cognitive = 5.59 sec  10 meter walk test: 8.78 sec; Gait speed = 3.74 ft/sec  Functional gait assessment: 26/30, 25-28 indicates a low risk fall (12/22/23)    PATIENT SURVEYS:  ABC scale 1540 / 1600 = 96.3 %   TODAY'S TREATMENT:   12/30/2023 THERAPEUTIC EXERCISE: To improve strength, ROM, and flexibility.  Demonstration, verbal and tactile cues throughout for technique. Rec Bike L4 x 6 min Modified Piriformis Stretch (Figure 4) w/ leg on stool 2 x 30' B Bridge w/ ADD 2 x 10 3-5' Side-lying clamshells 2 x 10 3-5' B  NEUROMUSCULAR RE-EDUCATION: To improve coordination, kinesthesia, posture, amplitude of movement, speed of movement to reduce bradykinesia, and reduce rigidity. PWR! Up x 10 PWR! Rock x 10 PWR! Twist x 10 --> 5 - limited secondary to low/mid back tightness at level of prior schwannoma resection PWR! Step x 10    12/22/2023  PHYSICAL PERFORMANCE TEST or MEASUREMENT: FGA = 26/30, 25-28 indicates a low risk fall  Functional Gait  Assessment  Gait Level Surface Walks 20 ft in less than 7 sec but greater than 5.5 sec, uses assistive device, slower speed, mild gait deviations, or deviates 6-10 in outside of the 12 in walkway width.   Change in Gait Speed Able to change speed, demonstrates mild gait deviations, deviates 6-10 in outside of the 12 in walkway width, or no gait deviations, unable to achieve a major change in velocity, or uses a change in velocity, or uses an assistive device.   Gait  with Horizontal Head Turns Performs head turns smoothly with no change in gait. Deviates no more than 6 in outside 12 in walkway width   Gait with Vertical Head Turns Performs head turns with no change in gait. Deviates no more than 6 in outside 12 in walkway width.   Gait and Pivot Turn Pivot turns safely within 3 sec and stops quickly with no loss of balance.   Step Over Obstacle Is able to step over 2 stacked shoe boxes taped together (9 in total height) without changing gait speed. No evidence of imbalance.   Gait with Narrow Base of Support Ambulates 4-7 steps.   Gait with Eyes Closed Walks 20 ft, no assistive devices, good speed, no evidence of imbalance, normal gait pattern, deviates no more than 6 in outside 12 in walkway width. Ambulates 20 ft in less than 7 sec.   Ambulating Backwards Walks 20 ft, no assistive devices, good speed, no evidence for imbalance, normal gait   Steps Alternating feet, no rail.   Total Score 26   FGA comment: 25-28 = low risk fall      THERAPEUTIC EXERCISE: To improve ROM and flexibility.  Demonstration, verbal and tactile cues throughout for technique. Supine B HS with strap 2 x 30" each, 2nd rep with dorsiflexion to increase calf stretch (too intense) R Mod Thomas quad/hip flexor stretch with strap x 30" Seated hip hinge HS stretch x 30" - better tolerated than supine Side-sitting lunge position hip flexor stretch over edge of chair x 30" Standing lunge position hip flexor stretch x  30" - better tolerated than supine or sitting Hooklying LTR 10 x 5" S/L open book stretch 10 x 5" bil  NEUROMUSCULAR RE-EDUCATION: To improve coordination, kinesthesia, posture, amplitude of movement, speed of movement to reduce bradykinesia, and reduce rigidity.  PWR! Moves in sitting: PWR! Up x 10 PWR! Rock x 3 - limited secondary to R hip discomfort PWR! Twist x 5 - limited secondary to low/mid back tightness at level of prior schwannoma resection PWR! Step deferred due  to limited tolerance for previous movement patterns   12/16/2023 - Eval SELF CARE:  Reviewed eval findings and role of PT in addressing identified deficits as well as overview of PWR! Moves.    PATIENT EDUCATION:  Education details: HEP review, HEP update, continue with current HEP, and overview of PWR! Moves   Person educated: Patient Education method: Explanation, Demonstration, Verbal cues, and Handouts Education comprehension: verbalized understanding, returned demonstration, verbal cues required, and needs further education  HOME EXERCISE PROGRAM: Access Code: [email or web address] URL: [email or web address] Date: 12/30/2023 Prepared by: Ara Mano Joseph-Greene  Exercises - Seated Hamstring Stretch  - 2 x daily - 7 x weekly - 3 reps - 30 sec hold - Standing Hip Flexor Stretch  - 2 x daily - 7 x weekly - 3 reps - 30 sec hold - Supine Lower Trunk Rotation  - 2 x daily - 7 x weekly - 5 reps - 10 sec hold - Sidelying Thoracic Rotation with Open Book  - 2 x daily - 7 x weekly - 10 reps - 5 sec hold - Clamshell with Resistance  - 1 x daily - 7 x weekly - 2 sets - 10 reps - 3-5 hold - Supine Bridge with Mini Swiss Ball Between Knees  - 1 x daily - 7 x weekly - 2 sets - 10 reps - 3-5 hold - Seated Figure 4 Piriformis Stretch  - 1 x daily - 7 x weekly - 2 sets - 5 reps - 30 hold   ASSESSMENT:  CLINICAL IMPRESSION: Dayrin reports pain only in her L heel. She states that she is now able to roll over in bed without any issues. Reviewed current HEP then removed and modified some of the stretches due to her being unable to do on bed or couch. Incorporated hip strengthening exercises which she tolerated well and modified by increasing resistance and changing position to challenge her since seated clamshells were easy. She was able to get through [facility]! Moves but noted some tightness/restriction at site of thoracic schwannoma resection with PWR! Twist, therefore, reduced the number of reps. Will continue  with incorporating PWR! Moves next session. Ayani will benefit from continued skilled PT to address ongoing deficits to improve mobility, activity tolerance and maximum functional independence while incorporating more PWR! Moves to HEP to delay progression of Parkinson's symptoms.  OBJECTIVE IMPAIRMENTS: Abnormal gait, decreased activity tolerance, decreased coordination, difficulty walking, decreased strength, impaired perceived functional ability, impaired flexibility, impaired sensation, improper body mechanics, postural dysfunction, and pain.   ACTIVITY LIMITATIONS: sleeping, stairs, transfers, bed mobility, and locomotion level  PARTICIPATION LIMITATIONS: community activity, occupation, and travel  PERSONAL FACTORS: Past/current experiences, Time since onset of injury/illness/exacerbation, and 3+ comorbidities: Thoracic schwannoma s/p resection 2005 (Dr. Wynetta Emery), secondary hypothyroidism s/p thyroidectomy for tumor, chronic back pain, cervical and lumbar DDD, L LE N/T mid-thigh down into the lower leg and foot starting ~06/2023, constant numbness of the left 4th toe since back injury at the age of 71, L plantar fasciitis, L sided weakness, L hand  tremor  are also affecting patient's functional outcome.   REHAB POTENTIAL: Excellent  CLINICAL DECISION MAKING: Evolving/moderate complexity  EVALUATION COMPLEXITY: Moderate   GOALS: Goals reviewed with patient? Yes  SHORT TERM GOALS: Target date: 01/13/2024  Patient will be independent with initial HEP. Baseline:  Goal status: IN PROGRESS  2.  Patient will be educated on strategies to decrease risk of falls.  Baseline:  Goal status: IN PROGRESS  LONG TERM GOALS: Target date: 02/10/2024  Patient will be independent with ongoing/advanced HEP for self-management at home incorporating PWR! Moves as indicated.  Baseline:  Goal status: IN PROGRESS  2.  Patient will be able to ambulate 600' w/o AD over variable surfaces with good safety to  access community.  Baseline:  Goal status: IN PROGRESS  3.  Patient will be able to ascend/descend stairs safely with or w/o single HR for safety with household and community ambulation.  Baseline:  Goal status: IN PROGRESS   4.  Patient will demonstrate gait speed of >/= 4.37 ft/sec (1.33 m/s) to demonstrate normal walking speed.  Baseline: 3.74 ft/sec Goal status: IN PROGRESS  5.  Patient will demonstrate at least 4 point improvement on FGA to improve gait stability and reduce risk for falls. (MCID = 4 points). Baseline: 26/30 (12/22/23) Goal status: IN PROGRESS  6.  Patient will verbalize understanding of local Parkinson's disease community resources, including community fitness post d/c. Baseline:  Goal status: IN PROGRESS   PLAN:  PT FREQUENCY: 2x/week  PT DURATION: 8 weeks  PLANNED INTERVENTIONS: 97164- PT Re-evaluation, 97110-Therapeutic exercises, 97530- Therapeutic activity, 97112- Neuromuscular re-education, 97535- Self Care, 16109- Manual therapy, [phone]- Gait training, [phone]- Ultrasound, [phone]- Ionotophoresis 4mg /ml Dexamethasone, Patient/Family education, Balance training, Stair training, Taping, Dry Needling, Cryotherapy, Moist heat, and 97750- Physical Performance Test or Measurement  PLAN FOR NEXT SESSION: Provide education on fall risk prevention, progress proximal LE/core flexibility and add hip strengthening; continue to introduce PWR! Moves as tolerated   Jahaan Vanwagner Joseph-Greene, Student-PT 12/30/2023, 11:43 AM

## 2024-01-13 ENCOUNTER — Encounter: Payer: Self-pay | Admitting: Physical Therapy

## 2024-01-13 ENCOUNTER — Ambulatory Visit: Admitting: Physical Therapy

## 2024-01-13 DIAGNOSIS — R258 Other abnormal involuntary movements: Secondary | ICD-10-CM

## 2024-01-13 DIAGNOSIS — R2689 Other abnormalities of gait and mobility: Secondary | ICD-10-CM | POA: Diagnosis not present

## 2024-01-13 DIAGNOSIS — R252 Cramp and spasm: Secondary | ICD-10-CM

## 2024-01-13 DIAGNOSIS — M6281 Muscle weakness (generalized): Secondary | ICD-10-CM

## 2024-01-13 DIAGNOSIS — M79672 Pain in left foot: Secondary | ICD-10-CM

## 2024-01-13 NOTE — Therapy (Signed)
 OUTPATIENT PHYSICAL THERAPY TREATMENT   Patient Name: Kristine Richmond MRN: [id number] DOB:12/14/1955, 68 y.o., female Today's Date: 01/13/2024   END OF SESSION:  PT End of Session - 01/13/24 1027     Visit Number 4    Date for PT Re-Evaluation 02/10/24    Authorization Type [employer]    PT Start Time 0930    PT Stop Time 1019    PT Time Calculation (min) 49 min    Activity Tolerance Patient tolerated treatment well    Behavior During Therapy WFL for tasks assessed/performed                Past Medical History:  Diagnosis Date   DDD (degenerative disc disease), cervical    DDD (degenerative disc disease), lumbar    L4-L5   Hypothyroidism    thyorid removed   Tumor    back   Past Surgical History:  Procedure Laterality Date   DILATION AND CURETTAGE OF UTERUS     3   THYROIDECTOMY     tumor removed Left    back   Patient Active Problem List   Diagnosis Date Noted   Chronic back pain     PCP:  No PCP  REFERRING PROVIDER: Patel, Donika K, DO   REFERRING DIAG: G20.A1 (ICD-10-CM) - Parkinson's disease without dyskinesia or fluctuating manifestations (HCC)   THERAPY DIAG:  Other abnormalities of gait and mobility  Other abnormal involuntary movements  Muscle weakness (generalized)  Pain in left foot  Cramp and spasm  RATIONALE FOR EVALUATION AND TREATMENT: Rehabilitation  ONSET DATE: Parkinson's diagnosis as of 11/26/2023; symptoms first noticed ~2 yrs ago  NEXT MD VISIT: 03/15/24   SUBJECTIVE:                                                                                                                                                                                                         SUBJECTIVE STATEMENT: Kristine Richmond is feeling sore today especially in her L anterior deltoid. She feels like she strained something and doesn't want to hurt herself. Didn't perform exercises as much because she was traveling.  Pt accompanied by: self  PAIN: Are you having  pain? Yes: NPRS scale: 2/10 when walking  Pain location: L heel/plantar fascia Pain description: stabbing  Aggravating factors: walking  Relieving factors: rest   PERTINENT HISTORY:  Thoracic schwannoma s/p resection 2005 (Kristine Richmond), secondary hypothyroidism s/p thyroidectomy for tumor, chronic back pain, cervical and lumbar DDD, L LE N/T mid-thigh down into the lower leg and foot starting ~06/2023, constant numbness of  the left 4th toe since back injury at the age of 75, L plantar fasciitis, L sided weakness, L hand tremor  PRECAUTIONS: None  RED FLAGS: None  WEIGHT BEARING RESTRICTIONS: No  FALLS:  Has patient fallen in last 6 months? No  LIVING ENVIRONMENT: Lives with: lives with their spouse Lives in: House/apartment Stairs: Yes: Internal: 14 steps; on right going up and External: 2 or 3 steps; none and R rail going up 3 front steps, no rail in garage (2 steps) Has following equipment at home: None  OCCUPATION: FT - mostly deskwork (change management), occasional travel  PLOF: Independent and Leisure: painting, drawing, sewing; sit to stand for exercises, rowing machine (not typically used), stationary bike w/o tension  PATIENT GOALS: "To be able to walk normal."   OBJECTIVE: (objective measures completed at initial evaluation unless otherwise dated)  DIAGNOSTIC FINDINGS:  11/21/23 - NM Brain DATSCAN FINDINGS: Absent radiotracer activity LEFT and RIGHT putamen. Decreased radiotracer activity in head of the RIGHT caudate nucleus compared to the LEFT.   IMPRESSION: Bilateral decreased striatal Ioflupane activity as above. This pattern can be seen in Parkinsonian syndromes.   Of note, DaTSCAN is not diagnostic of Parkinsonian syndromes, which remains a clinical diagnosis. DaTscan is an adjuvant test to aid in the clinical diagnosis of Parkinsonian syndromes.  10/27/23 - MR Brain IMPRESSION: Essentially normal brain MRI with no acute intracranial pathology or other finding  to explain the patient's symptoms.  09/01/23 - MR Lumbar spine IMPRESSION: 1. Generally very mild for age lumbar spine degeneration, and capacious lumbar spinal canal. No spinal stenosis or convincing neural impingement. 2. Chronic L5-S1 disc and endplate degeneration, with mild left L5 neural foraminal stenosis, does not appear significantly changed since 2012.  08/01/23 - MR Thoracic spine IMPRESSION: 1. Normal MRI of the thoracic cord. 2. 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 This herniation has been present since at least 2012.  08/01/23 - MR Cervical spine IMPRESSION: 1. No contrast-enhancing lesion is visualized. 2. Moderate left sided neuroforaminal narrowing at C3-C4, C4-C5, and C5-C6. 3. No evidence of high grade spinal canal stenosis.  COGNITION: Overall cognitive status: Impaired and pt noting more issues with memory - requiring notes where she used to be able to do presentations w/o notes   SENSATION: Constant tingling in L UE and LE  COORDINATION: Heel-toe WFL Heel to shin mildly impaired on L  POSTURE:  rounded shoulders, forward head, and flexed trunk   MUSCLE LENGTH: Hamstrings: mild tight R, mod tight L ITB: mild tight B Piriformis: mild tight B Hip flexors: mod tight R>L Quads: mild tight B Heelcord: mild/mod tight L>R  LOWER EXTREMITY ROM:    Grossly WFL except L ankle DF Active  Right eval Left eval  Ankle dorsiflexion 18 8  Ankle plantarflexion 48 46  Ankle inversion    Ankle eversion     (Blank rows = not tested)  LOWER EXTREMITY MMT:    MMT Right eval Left eval  Hip flexion 4 4  Hip extension 4 4  Hip abduction 3+ 3+  Hip adduction 4 4  Hip internal rotation 4+ 4+  Hip external rotation 4- 4-  Knee flexion 5 5  Knee extension 5 5  Ankle dorsiflexion 5 4+  Ankle plantarflexion    Ankle inversion    Ankle eversion    (Blank rows = not tested)  BED MOBILITY:  Independent but pt notes  more difficulty than she used to have  with rolling over  TRANSFERS: Assistive device utilized: None  Sit to stand: Complete Independence Stand to sit: Complete Independence Chair to chair: Complete Independence Floor:  NT  GAIT: Distance walked: clinic distances Assistive device utilized: None Level of assistance: Complete Independence Gait pattern: decreased arm swing- Right and decreased arm swing- Left Comments:   FUNCTIONAL TESTS:  5 times sit to stand: 11.96 sec Timed up and go (TUG): Normal = 5.82 sec; Manual = 6.22 sec; Cognitive = 5.59 sec  10 meter walk test: 8.78 sec; Gait speed = 3.74 ft/sec  Functional gait assessment: 26/30, 25-28 indicates a low risk fall (12/22/23)    PATIENT SURVEYS:  ABC scale 1540 / 1600 = 96.3 %   TODAY'S TREATMENT:   01/13/2024 THERAPEUTIC EXERCISE: To improve strength, endurance, ROM, and flexibility.  Demonstration, verbal and tactile cues throughout for technique. Rec Bike L4 x 6 min Doorway pec stretch @ 90 - didn't feel stretch; felt stuck Doorway pec stretch @ 60 - was able to feel stretch Bridge + ABD w/ GTB x 10 x 3-5" Side-lying clamshells w/ GTB x 10 B Seated knee marches w/ GTB x 10 B - felt B anteromedial knee pain Lateral side bank walks 2 x 10 - don't feel plantar fasciitis   MANUAL THERAPY: To promote normalized muscle tension, improved flexibility, and reduced pain utilizing therapeutic massage. STM to L anterior deltoid   SELF CARE: Provided education to reduce fall risk and to promote safe home environment. Reviewed the "Check for Safety - Home Fall Prevention Checklist for Older Adults" to help identify fall risk hazards in the home along with strategies to reduce fall risk at home   12/30/2023 THERAPEUTIC EXERCISE: To improve strength, ROM, and flexibility.  Demonstration, verbal and tactile cues throughout for technique. Rec Bike L4 x 6 min Modified Piriformis Stretch (Figure 4) w/ leg on stool 2 x 30' B Bridge  w/ ADD 2 x 10 3-5' Side-lying clamshells 2 x 10 3-5' B  NEUROMUSCULAR RE-EDUCATION: To improve coordination, kinesthesia, posture, amplitude of movement, speed of movement to reduce bradykinesia, and reduce rigidity. PWR! Up x 10 PWR! Rock x 10 PWR! Twist x 10 --> 5 - limited secondary to low/mid back tightness at level of prior schwannoma resection PWR! Step x 10    12/22/2023  PHYSICAL PERFORMANCE TEST or MEASUREMENT: FGA = 26/30, 25-28 indicates a low risk fall  Functional Gait  Assessment  Gait Level Surface Walks 20 ft in less than 7 sec but greater than 5.5 sec, uses assistive device, slower speed, mild gait deviations, or deviates 6-10 in outside of the 12 in walkway width.   Change in Gait Speed Able to change speed, demonstrates mild gait deviations, deviates 6-10 in outside of the 12 in walkway width, or no gait deviations, unable to achieve a major change in velocity, or uses a change in velocity, or uses an assistive device.   Gait with Horizontal Head Turns Performs head turns smoothly with no change in gait. Deviates no more than 6 in outside 12 in walkway width   Gait with Vertical Head Turns Performs head turns with no change in gait. Deviates no more than 6 in outside 12 in walkway width.   Gait and Pivot Turn Pivot turns safely within 3 sec and stops quickly with no loss of balance.   Step Over Obstacle Is able to step over 2 stacked shoe boxes taped together (9 in total height) without changing gait speed. No evidence of imbalance.  Gait with Narrow Base of Support Ambulates 4-7 steps.   Gait with Eyes Closed Walks 20 ft, no assistive devices, good speed, no evidence of imbalance, normal gait pattern, deviates no more than 6 in outside 12 in walkway width. Ambulates 20 ft in less than 7 sec.   Ambulating Backwards Walks 20 ft, no assistive devices, good speed, no evidence for imbalance, normal gait   Steps Alternating feet, no rail.   Total Score 26   FGA comment: 25-28 =  low risk fall      THERAPEUTIC EXERCISE: To improve ROM and flexibility.  Demonstration, verbal and tactile cues throughout for technique. Supine B HS with strap 2 x 30" each, 2nd rep with dorsiflexion to increase calf stretch (too intense) R Mod Thomas quad/hip flexor stretch with strap x 30" Seated hip hinge HS stretch x 30" - better tolerated than supine Side-sitting lunge position hip flexor stretch over edge of chair x 30" Standing lunge position hip flexor stretch x 30" - better tolerated than supine or sitting Hooklying LTR 10 x 5" S/L open book stretch 10 x 5" bil  NEUROMUSCULAR RE-EDUCATION: To improve coordination, kinesthesia, posture, amplitude of movement, speed of movement to reduce bradykinesia, and reduce rigidity.  PWR! Moves in sitting: PWR! Up x 10 PWR! Rock x 3 - limited secondary to R hip discomfort PWR! Twist x 5 - limited secondary to low/mid back tightness at level of prior schwannoma resection PWR! Step deferred due to limited tolerance for previous movement patterns   PATIENT EDUCATION:  Education details: HEP update, continue with current HEP, Check for Safety - Home Fall Prevention Checklist for Older Adults , and overview of PWR! Moves   Person educated: Patient Education method: Explanation, Demonstration, Verbal cues, and Handouts Education comprehension: verbalized understanding, returned demonstration, verbal cues required, and needs further education  HOME EXERCISE PROGRAM: Access Code: [email or web address] URL: [email or web address] Date: 01/13/2024 Prepared by: Zelma Snead Joseph-Greene  Exercises - Seated Hamstring Stretch  - 2 x daily - 7 x weekly - 3 reps - 30 sec hold - Standing Hip Flexor Stretch  - 2 x daily - 7 x weekly - 3 reps - 30 sec hold - Supine Lower Trunk Rotation  - 2 x daily - 7 x weekly - 5 reps - 10 sec hold - Sidelying Thoracic Rotation with Open Book  - 2 x daily - 7 x weekly - 10 reps - 5 sec hold - Clamshell with Resistance   - 1 x daily - 7 x weekly - 2 sets - 10 reps - 3-5 hold - Supine Bridge with Mini Swiss Ball Between Knees  - 1 x daily - 7 x weekly - 2 sets - 10 reps - 3-5 hold - Seated Figure 4 Piriformis Stretch  - 1 x daily - 7 x weekly - 2 sets - 5 reps - 30 hold - Doorway Pec Stretch at 60 Elevation  - 2 x daily - 7 x weekly - 2 sets - 30 sec hold - Side Stepping with Resistance at Thighs  - 1 x daily - 5 x weekly - 2 sets - 10 reps - Bridge with Hip Abduction and Resistance  - 1 x daily - 5 x weekly - 2 sets - 10 reps  Patient Education - Check for Safety   ASSESSMENT:  CLINICAL IMPRESSION: Aolanis returns to PT after 2 weeks stating that she tried to do her HEP as best as she can while on vacation. She believes that the exercises involving  UE has caused her soreness and tightness in L anterior deltoid. To reduce the pain and muscle tension, we started with STM to release tension in L anterior deltoid and a doorway pec stretch. Tried it at 47 but she felt like she couldn't move, so we tried it at 70 and was able to tolerate it better at that angle. She began feeling dizzy during the doorway stretch exercise, so we took a break so she can gather herself. Because of the dizziness and L anterior deltoid tension, we avoided doing PWR! Moves and focused primarily on hip strengthening exercises. She was able to tolerate fairly well, but she did notice anteromedial knee pain with the knee marches so we discontinued that and focused on other hip strengthening exercises. We will continue with PWR! Moves in the next session if patient is able to tolerate. Avia will benefit from continued skilled PT to address ongoing deficits to improve mobility, activity tolerance and maximum functional independence while incorporating more PWR! Moves to HEP to delay progression of Parkinson's symptoms.  OBJECTIVE IMPAIRMENTS: Abnormal gait, decreased activity tolerance, decreased coordination, difficulty walking, decreased strength,  impaired perceived functional ability, impaired flexibility, impaired sensation, improper body mechanics, postural dysfunction, and pain.   ACTIVITY LIMITATIONS: sleeping, stairs, transfers, bed mobility, and locomotion level  PARTICIPATION LIMITATIONS: community activity, occupation, and travel  PERSONAL FACTORS: Past/current experiences, Time since onset of injury/illness/exacerbation, and 3+ comorbidities: Thoracic schwannoma s/p resection 2005 (Dr. Wynetta Emery), secondary hypothyroidism s/p thyroidectomy for tumor, chronic back pain, cervical and lumbar DDD, L LE N/T mid-thigh down into the lower leg and foot starting ~06/2023, constant numbness of the left 4th toe since back injury at the age of 28, L plantar fasciitis, L sided weakness, L hand tremor  are also affecting patient's functional outcome.   REHAB POTENTIAL: Excellent  CLINICAL DECISION MAKING: Evolving/moderate complexity  EVALUATION COMPLEXITY: Moderate   GOALS: Goals reviewed with patient? Yes  SHORT TERM GOALS: Target date: 01/13/2024  Patient will be independent with initial HEP. Baseline:  Goal status: MET 01/13/24  2.  Patient will be educated on strategies to decrease risk of falls.  Baseline:  Goal status: MET 01/13/24   LONG TERM GOALS: Target date: 02/10/2024  Patient will be independent with ongoing/advanced HEP for self-management at home incorporating PWR! Moves as indicated.  Baseline:  Goal status: IN PROGRESS  2.  Patient will be able to ambulate 600' w/o AD over variable surfaces with good safety to access community.  Baseline:  Goal status: IN PROGRESS  3.  Patient will be able to ascend/descend stairs safely with or w/o single HR for safety with household and community ambulation.  Baseline:  Goal status: IN PROGRESS   4.  Patient will demonstrate gait speed of >/= 4.37 ft/sec (1.33 m/s) to demonstrate normal walking speed.  Baseline: 3.74 ft/sec Goal status: IN PROGRESS  5.  Patient will  demonstrate at least 4 point improvement on FGA to improve gait stability and reduce risk for falls. (MCID = 4 points). Baseline: 26/30 (12/22/23) Goal status: IN PROGRESS  6.  Patient will verbalize understanding of local Parkinson's disease community resources, including community fitness post d/c. Baseline:  Goal status: IN PROGRESS   PLAN:  PT FREQUENCY: 2x/week  PT DURATION: 8 weeks  PLANNED INTERVENTIONS: 97164- PT Re-evaluation, 97110-Therapeutic exercises, 97530- Therapeutic activity, [id number]- Neuromuscular re-education, 97535- Self Care, 24401- Manual therapy, [id number]- Gait training, [phone]- Ultrasound, [id number]- Ionotophoresis 4mg /ml Dexamethasone, Patient/Family education, Balance training, Stair training, Taping, Dry Needling, Cryotherapy, Moist heat,  and [id number]- Physical Performance Test or Measurement  PLAN FOR NEXT SESSION: Progress proximal LE/core flexibility and hip strengthening; continue to introduce PWR! Moves as tolerated; scapular/posture training   Eulanda Dorion Joseph-Greene, Student-PT 01/13/2024, 10:52 AM

## 2024-01-15 ENCOUNTER — Ambulatory Visit: Admitting: Physical Therapy

## 2024-01-20 ENCOUNTER — Encounter: Admitting: Physical Therapy

## 2024-01-22 ENCOUNTER — Encounter: Payer: Self-pay | Admitting: Physical Therapy

## 2024-01-22 ENCOUNTER — Ambulatory Visit: Admitting: Physical Therapy

## 2024-01-22 DIAGNOSIS — M79672 Pain in left foot: Secondary | ICD-10-CM

## 2024-01-22 DIAGNOSIS — R2689 Other abnormalities of gait and mobility: Secondary | ICD-10-CM | POA: Diagnosis not present

## 2024-01-22 DIAGNOSIS — R258 Other abnormal involuntary movements: Secondary | ICD-10-CM

## 2024-01-22 DIAGNOSIS — M6281 Muscle weakness (generalized): Secondary | ICD-10-CM

## 2024-01-22 DIAGNOSIS — R252 Cramp and spasm: Secondary | ICD-10-CM

## 2024-01-22 NOTE — Therapy (Signed)
 OUTPATIENT PHYSICAL THERAPY TREATMENT   Patient Name: Kristine Richmond MRN: [id number] DOB:[birth year], 68 y.o., female Today's Date: 01/22/2024   END OF SESSION:  PT End of Session - 01/22/24 1145     Visit Number 5    Date for PT Re-Evaluation 02/10/24    Authorization Type [employer]    PT Start Time 1145    PT Stop Time 1232    PT Time Calculation (min) 47 min    Activity Tolerance Patient tolerated treatment well    Behavior During Therapy WFL for tasks assessed/performed                 Past Medical History:  Diagnosis Date   DDD (degenerative disc disease), cervical    DDD (degenerative disc disease), lumbar    L4-L5   Hypothyroidism    thyorid removed   Tumor    back   Past Surgical History:  Procedure Laterality Date   DILATION AND CURETTAGE OF UTERUS     3   THYROIDECTOMY     tumor removed Left    back   Patient Active Problem List   Diagnosis Date Noted   Chronic back pain     PCP:  No PCP  REFERRING PROVIDER: Patel, Donika K, DO   REFERRING DIAG: G20.A1 (ICD-10-CM) - Parkinson's disease without dyskinesia or fluctuating manifestations (HCC)   THERAPY DIAG:  Other abnormalities of gait and mobility  Other abnormal involuntary movements  Muscle weakness (generalized)  Pain in left foot  Cramp and spasm  RATIONALE FOR EVALUATION AND TREATMENT: Rehabilitation  ONSET DATE: Parkinson's diagnosis as of 11/26/2023; symptoms first noticed ~2 yrs ago  NEXT MD VISIT: 03/15/24   SUBJECTIVE:                                                                                                                                                                                                         SUBJECTIVE STATEMENT: Rokhaya states that she is able to walk around more and participate in activities because she wasn't limited by her L plantar fasciitis, says her plantar fasciitis is the "best she's felt in 2 years". L anterior shoulder pain is 1.5/10 today. Feels a  little stiff in chest area.  Pt accompanied by: self  PAIN: Are you having pain? Yes: NPRS scale: 1/10 when walking  Pain location: L heel/plantar fascia Pain description: stabbing  Aggravating factors: walking  Relieving factors: rest   PERTINENT HISTORY:  Thoracic schwannoma s/p resection 2005 (Dr. Lamon Pillow), secondary hypothyroidism s/p thyroidectomy for tumor, chronic back pain, cervical and  lumbar DDD, L LE N/T mid-thigh down into the lower leg and foot starting ~06/2023, constant numbness of the left 4th toe since back injury at the age of 56, L plantar fasciitis, L sided weakness, L hand tremor  PRECAUTIONS: None  RED FLAGS: None  WEIGHT BEARING RESTRICTIONS: No  FALLS:  Has patient fallen in last 6 months? No  LIVING ENVIRONMENT: Lives with: lives with their spouse Lives in: House/apartment Stairs: Yes: Internal: 14 steps; on right going up and External: 2 or 3 steps; none and R rail going up 3 front steps, no rail in garage (2 steps) Has following equipment at home: None  OCCUPATION: FT - mostly deskwork (change management), occasional travel  PLOF: Independent and Leisure: painting, drawing, sewing; sit to stand for exercises, rowing machine (not typically used), stationary bike w/o tension  PATIENT GOALS: "To be able to walk normal."   OBJECTIVE: (objective measures completed at initial evaluation unless otherwise dated)  DIAGNOSTIC FINDINGS:  11/21/23 - NM Brain DATSCAN  FINDINGS: Absent radiotracer activity LEFT and RIGHT putamen. Decreased radiotracer activity in head of the RIGHT caudate nucleus compared to the LEFT.   IMPRESSION: Bilateral decreased striatal Ioflupane activity as above. This pattern can be seen in Parkinsonian syndromes.   Of note, DaTSCAN  is not diagnostic of Parkinsonian syndromes, which remains a clinical diagnosis. DaTscan  is an adjuvant test to aid in the clinical diagnosis of Parkinsonian syndromes.  10/27/23 - MR  Brain IMPRESSION: Essentially normal brain MRI with no acute intracranial pathology or other finding to explain the patient's symptoms.  09/01/23 - MR Lumbar spine IMPRESSION: 1. Generally very mild for age lumbar spine degeneration, and capacious lumbar spinal canal. No spinal stenosis or convincing neural impingement. 2. Chronic L5-S1 disc and endplate degeneration, with mild left L5 neural foraminal stenosis, does not appear significantly changed since 2012.  08/01/23 - MR Thoracic spine IMPRESSION: 1. Normal MRI of the thoracic cord. 2. 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 This herniation has been present since at least 2012.  08/01/23 - MR Cervical spine IMPRESSION: 1. No contrast-enhancing lesion is visualized. 2. Moderate left sided neuroforaminal narrowing at C3-C4, C4-C5, and C5-C6. 3. No evidence of high grade spinal canal stenosis.  COGNITION: Overall cognitive status: Impaired and pt noting more issues with memory - requiring notes where she used to be able to do presentations w/o notes   SENSATION: Constant tingling in L UE and LE  COORDINATION: Heel-toe WFL Heel to shin mildly impaired on L  POSTURE:  rounded shoulders, forward head, and flexed trunk   MUSCLE LENGTH: Hamstrings: mild tight R, mod tight L ITB: mild tight B Piriformis: mild tight B Hip flexors: mod tight R>L Quads: mild tight B Heelcord: mild/mod tight L>R  LOWER EXTREMITY ROM:    Grossly WFL except L ankle DF Active  Right eval Left eval  Ankle dorsiflexion 18 8  Ankle plantarflexion 48 46  Ankle inversion    Ankle eversion     (Blank rows = not tested)  LOWER EXTREMITY MMT:    MMT Right eval Left eval  Hip flexion 4 4  Hip extension 4 4  Hip abduction 3+ 3+  Hip adduction 4 4  Hip internal rotation 4+ 4+  Hip external rotation 4- 4-  Knee flexion 5 5  Knee extension 5 5  Ankle dorsiflexion 5 4+  Ankle plantarflexion    Ankle  inversion    Ankle eversion    (Blank rows =  not tested)  BED MOBILITY:  Independent but pt notes more difficulty than she used to have with rolling over  TRANSFERS: Assistive device utilized: None  Sit to stand: Complete Independence Stand to sit: Complete Independence Chair to chair: Complete Independence Floor:  NT  GAIT: Distance walked: clinic distances Assistive device utilized: None Level of assistance: Complete Independence Gait pattern: decreased arm swing- Right and decreased arm swing- Left Comments:   FUNCTIONAL TESTS:  5 times sit to stand: 11.96 sec Timed up and go (TUG): Normal = 5.82 sec; Manual = 6.22 sec; Cognitive = 5.59 sec  10 meter walk test: 8.78 sec; Gait speed = 3.74 ft/sec  Functional gait assessment: 26/30, 25-28 indicates a low risk fall (12/22/23)    PATIENT SURVEYS:  ABC scale 1540 / 1600 = 96.3 %   TODAY'S TREATMENT:   01/22/2024 THERAPEUTIC EXERCISE: To improve strength, endurance, ROM, and flexibility.  Demonstration, verbal and tactile cues throughout for technique. Rec Bike L4 x 6 min Doorway pec stretch @ 60 x 30" Thoracic extension x 30" - L arm was stiffer, only felt stretch in R side of chest and arm L single arm pec stretch in doorway x 30" "V" Wall Slides x 5 - felt soreness/pain in L upper arm "V" Wall Slides + arm lift x 5 felt soreness/pain in L upper arm Standing scap retraction + shoulder row w/ RTB 2 x 10 Standing scap retraction + shoulder ext w/ RTB 2 x 10 Standing scap retraction + ER w/ RTB 2 x 10  NEUROMUSCULAR RE-EDUCATION: To improve balance, coordination, kinesthesia, posture, proprioception, amplitude of movement, speed of movement to reduce bradykinesia, and reduce rigidity. PWR! Moves in sitting:  Up x 10 Rock x 10 Twist x 10 Step x 10  PWR! Moves in standing:  Up x 10 Rock x 10 Twist x 10 Step x 10  01/13/2024 THERAPEUTIC EXERCISE: To improve strength, endurance, ROM, and flexibility.  Demonstration,  verbal and tactile cues throughout for technique. Rec Bike L4 x 6 min Doorway pec stretch @ 90 - didn't feel stretch; felt stuck Doorway pec stretch @ 60 - was able to feel stretch Bridge + ABD w/ GTB x 10 x 3-5" Side-lying clamshells w/ GTB x 10 B Seated knee marches w/ GTB x 10 B - felt B anteromedial knee pain Lateral side bank walks 2 x 10 - don't feel plantar fasciitis   MANUAL THERAPY: To promote normalized muscle tension, improved flexibility, and reduced pain utilizing therapeutic massage. STM to L anterior deltoid   SELF CARE: Provided education to reduce fall risk and to promote safe home environment. Reviewed the "Check for Safety - Home Fall Prevention Checklist for Older Adults" to help identify fall risk hazards in the home along with strategies to reduce fall risk at home   12/30/2023 THERAPEUTIC EXERCISE: To improve strength, ROM, and flexibility.  Demonstration, verbal and tactile cues throughout for technique. Rec Bike L4 x 6 min Modified Piriformis Stretch (Figure 4) w/ leg on stool 2 x 30' B Bridge w/ ADD 2 x 10 3-5' Side-lying clamshells 2 x 10 3-5' B  NEUROMUSCULAR RE-EDUCATION: To improve coordination, kinesthesia, posture, amplitude of movement, speed of movement to reduce bradykinesia, and reduce rigidity. PWR! Up x 10 PWR! Rock x 10 PWR! Twist x 10 --> 5 - limited secondary to low/mid back tightness at level of prior schwannoma resection PWR! Step x 10    PATIENT EDUCATION:  Education details: HEP update, continue with current HEP, Check for  Safety - Home Fall Prevention Checklist for Older Adults , and overview of PWR! Moves   Person educated: Patient Education method: Explanation, Demonstration, Verbal cues, and Handouts Education comprehension: verbalized understanding, returned demonstration, verbal cues required, and needs further education  HOME EXERCISE PROGRAM: Access Code: [email or web address] URL: [email or web address] Date:  01/22/2024 Prepared by: Zaelynn Fuchs Joseph-Greene  Exercises - Seated Hamstring Stretch  - 2 x daily - 7 x weekly - 3 reps - 30 sec hold - Standing Hip Flexor Stretch  - 2 x daily - 7 x weekly - 3 reps - 30 sec hold - Supine Lower Trunk Rotation  - 2 x daily - 7 x weekly - 5 reps - 10 sec hold - Sidelying Thoracic Rotation with Open Book  - 2 x daily - 7 x weekly - 10 reps - 5 sec hold - Clamshell with Resistance  - 1 x daily - 7 x weekly - 2 sets - 10 reps - 3-5 hold - Supine Bridge with Mini Swiss Ball Between Knees  - 1 x daily - 7 x weekly - 2 sets - 10 reps - 3-5 hold - Seated Figure 4 Piriformis Stretch  - 1 x daily - 7 x weekly - 2 sets - 5 reps - 30 hold - Doorway Pec Stretch at 60 Elevation  - 2 x daily - 7 x weekly - 2 sets - 30 sec hold - Side Stepping with Resistance at Thighs  - 1 x daily - 5 x weekly - 2 sets - 10 reps - Bridge with Hip Abduction and Resistance  - 1 x daily - 5 x weekly - 2 sets - 10 reps - Shoulder External Rotation and Scapular Retraction with Resistance  - 1 x daily - 5 x weekly - 2 sets - 10 reps - Shoulder extension with resistance - Neutral  - 1 x daily - 5 x weekly - 2 sets - 10 reps - Standing Shoulder Row with Anchored Resistance  - 1 x daily - 5 x weekly - 2 sets - 10 reps  Patient Education - Check for Safety  PWR! Moves: - Seated - Standing    ASSESSMENT:  CLINICAL IMPRESSION: Nasim returns stating that she was able to walk around more and participate in activities because she wasn't limited by her L plantar fasciitis. She mentioned L anterior shoulder pain and tightness in chest, therefore, we started the session with pec stretches. She mentioned that she hasn't been doing the stretches as much so we needed to review them. Introduced seated thoracic extension stretch over chair to help open her chest up and increase flexibility, but felt limited in L shoulder and wasn't able to open up completely. Only felt stretch in her R shoulder/pec, so we  incorporated a single arm doorway pec stretch to alleviate abnormal muscle tension in L anterior shoulder. Added postural and scapular strengthening exercises to prevent the forward posture that is typically seen in Parkinson's patients. Started with V wall slides, however, she felt more pain/soreness in L upper arm. Discontinued that exercise and performed other scapular retraction exercises with theraband which she tolerated better. She was able to tolerate PWR! Moves in sitting and standing, but noted increase in back pain where she had thoracic schwannoma s/p resection surgery during PWR! TwistBurdette [place] will benefit from continued skilled PT to address ongoing deficits to improve mobility, activity tolerance and maximum functional independence while incorporating more PWR! Moves to HEP to delay progression of Parkinson's symptoms.  OBJECTIVE IMPAIRMENTS: Abnormal gait,  decreased activity tolerance, decreased coordination, difficulty walking, decreased strength, impaired perceived functional ability, impaired flexibility, impaired sensation, improper body mechanics, postural dysfunction, and pain.   ACTIVITY LIMITATIONS: sleeping, stairs, transfers, bed mobility, and locomotion level  PARTICIPATION LIMITATIONS: community activity, occupation, and travel  PERSONAL FACTORS: Past/current experiences, Time since onset of injury/illness/exacerbation, and 3+ comorbidities: Thoracic schwannoma s/p resection 2005 (Dr. Lamon Pillow), secondary hypothyroidism s/p thyroidectomy for tumor, chronic back pain, cervical and lumbar DDD, L LE N/T mid-thigh down into the lower leg and foot starting ~06/2023, constant numbness of the left 4th toe since back injury at the age of 26, L plantar fasciitis, L sided weakness, L hand tremor  are also affecting patient's functional outcome.   REHAB POTENTIAL: Excellent  CLINICAL DECISION MAKING: Evolving/moderate complexity  EVALUATION COMPLEXITY: Moderate   GOALS: Goals reviewed with  patient? Yes  SHORT TERM GOALS: Target date: 01/13/2024  Patient will be independent with initial HEP. Baseline:  Goal status: MET 01/13/24  2.  Patient will be educated on strategies to decrease risk of falls.  Baseline:  Goal status: MET 01/13/24   LONG TERM GOALS: Target date: 02/10/2024  Patient will be independent with ongoing/advanced HEP for self-management at home incorporating PWR! Moves as indicated.  Baseline:  Goal status: IN PROGRESS  2.  Patient will be able to ambulate 600' w/o AD over variable surfaces with good safety to access community.  Baseline:  Goal status: IN PROGRESS  3.  Patient will be able to ascend/descend stairs safely with or w/o single HR for safety with household and community ambulation.  Baseline:  Goal status: IN PROGRESS   4.  Patient will demonstrate gait speed of >/= 4.37 ft/sec (1.33 m/s) to demonstrate normal walking speed.  Baseline: 3.74 ft/sec Goal status: IN PROGRESS  5.  Patient will demonstrate at least 4 point improvement on FGA to improve gait stability and reduce risk for falls. (MCID = 4 points). Baseline: 26/30 (12/22/23) Goal status: IN PROGRESS  6.  Patient will verbalize understanding of local Parkinson's disease community resources, including community fitness post d/c. Baseline:  Goal status: IN PROGRESS   PLAN:  PT FREQUENCY: 2x/week  PT DURATION: 8 weeks  PLANNED INTERVENTIONS: 97164- PT Re-evaluation, 97110-Therapeutic exercises, 97530- Therapeutic activity, 97112- Neuromuscular re-education, 97535- Self Care, 16109- Manual therapy, [phone]- Gait training, [phone]- Ultrasound, [phone]- Ionotophoresis 4mg /ml Dexamethasone , Patient/Family education, Balance training, Stair training, Taping, Dry Needling, Cryotherapy, Moist heat, and 97750- Physical Performance Test or Measurement  PLAN FOR NEXT SESSION: Progress proximal LE/core flexibility and hip strengthening; continue to introduce PWR! Moves as tolerated; progress  scapular/posture training   Shadiamond Koska Joseph-Greene, Student-PT 01/22/2024, 2:03 PM

## 2024-01-27 ENCOUNTER — Ambulatory Visit: Admitting: Physical Therapy

## 2024-01-27 ENCOUNTER — Encounter: Payer: Self-pay | Admitting: Physical Therapy

## 2024-01-27 DIAGNOSIS — R252 Cramp and spasm: Secondary | ICD-10-CM

## 2024-01-27 DIAGNOSIS — R2689 Other abnormalities of gait and mobility: Secondary | ICD-10-CM

## 2024-01-27 DIAGNOSIS — M6281 Muscle weakness (generalized): Secondary | ICD-10-CM

## 2024-01-27 DIAGNOSIS — M79672 Pain in left foot: Secondary | ICD-10-CM

## 2024-01-27 DIAGNOSIS — R258 Other abnormal involuntary movements: Secondary | ICD-10-CM

## 2024-01-27 NOTE — Therapy (Addendum)
 OUTPATIENT PHYSICAL THERAPY TREATMENT   Patient Name: Kristine Richmond MRN: [id number] DOB:Oct 18, 1955, 68 y.o., female Today's Date: 01/27/2024   END OF SESSION:  PT End of Session - 01/27/24 1054     Visit Number 6    Date for PT Re-Evaluation 02/10/24    Authorization Type [employer]    PT Start Time 1053    PT Stop Time 1143    PT Time Calculation (min) 50 min    Activity Tolerance Patient tolerated treatment well    Behavior During Therapy WFL for tasks assessed/performed                  Past Medical History:  Diagnosis Date   DDD (degenerative disc disease), cervical    DDD (degenerative disc disease), lumbar    L4-L5   Hypothyroidism    thyorid removed   Tumor    back   Past Surgical History:  Procedure Laterality Date   DILATION AND CURETTAGE OF UTERUS     3   THYROIDECTOMY     tumor removed Left    back   Patient Active Problem List   Diagnosis Date Noted   Chronic back pain     PCP:  No PCP  REFERRING PROVIDER: Patel, Donika K, DO   REFERRING DIAG: G20.A1 (ICD-10-CM) - Parkinson's disease without dyskinesia or fluctuating manifestations (HCC)   THERAPY DIAG:  Other abnormalities of gait and mobility  Other abnormal involuntary movements  Muscle weakness (generalized)  Pain in left foot  Cramp and spasm  RATIONALE FOR EVALUATION AND TREATMENT: Rehabilitation  ONSET DATE: Parkinson's diagnosis as of 11/26/2023; symptoms first noticed ~2 yrs ago  NEXT MD VISIT: 03/15/24   SUBJECTIVE:                                                                                                                                                                                                         SUBJECTIVE STATEMENT: R medial knee pain due to squatting (PWR! Moves), 3/10 and feels that L shoulder is improving.  Pt accompanied by: self  PAIN: Are you having pain? Yes: NPRS scale: 1/10 when walking  Pain location: L heel/plantar fascia Pain  description: stabbing  Aggravating factors: walking  Relieving factors: rest   PERTINENT HISTORY:  Thoracic schwannoma s/p resection 2005 (Dr. Lamon Pillow), secondary hypothyroidism s/p thyroidectomy for tumor, chronic back pain, cervical and lumbar DDD, L LE N/T mid-thigh down into the lower leg and foot starting ~06/2023, constant numbness of the left 4th toe since back injury at the age of 28, L  plantar fasciitis, L sided weakness, L hand tremor  PRECAUTIONS: None  RED FLAGS: None  WEIGHT BEARING RESTRICTIONS: No  FALLS:  Has patient fallen in last 6 months? No  LIVING ENVIRONMENT: Lives with: lives with their spouse Lives in: House/apartment Stairs: Yes: Internal: 14 steps; on right going up and External: 2 or 3 steps; none and R rail going up 3 front steps, no rail in garage (2 steps) Has following equipment at home: None  OCCUPATION: FT - mostly deskwork (change management), occasional travel  PLOF: Independent and Leisure: painting, drawing, sewing; sit to stand for exercises, rowing machine (not typically used), stationary bike w/o tension  PATIENT GOALS: "To be able to walk normal."   OBJECTIVE: (objective measures completed at initial evaluation unless otherwise dated)  DIAGNOSTIC FINDINGS:  11/21/23 - NM Brain DATSCAN  FINDINGS: Absent radiotracer activity LEFT and RIGHT putamen. Decreased radiotracer activity in head of the RIGHT caudate nucleus compared to the LEFT.   IMPRESSION: Bilateral decreased striatal Ioflupane activity as above. This pattern can be seen in Parkinsonian syndromes.   Of note, DaTSCAN  is not diagnostic of Parkinsonian syndromes, which remains a clinical diagnosis. DaTscan  is an adjuvant test to aid in the clinical diagnosis of Parkinsonian syndromes.  10/27/23 - MR Brain IMPRESSION: Essentially normal brain MRI with no acute intracranial pathology or other finding to explain the patient's symptoms.  09/01/23 - MR Lumbar spine IMPRESSION: 1.  Generally very mild for age lumbar spine degeneration, and capacious lumbar spinal canal. No spinal stenosis or convincing neural impingement. 2. Chronic L5-S1 disc and endplate degeneration, with mild left L5 neural foraminal stenosis, does not appear significantly changed since 2012.  08/01/23 - MR Thoracic spine IMPRESSION: 1. Normal MRI of the thoracic cord. 2. 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 This herniation has been present since at least 2012.  08/01/23 - MR Cervical spine IMPRESSION: 1. No contrast-enhancing lesion is visualized. 2. Moderate left sided neuroforaminal narrowing at C3-C4, C4-C5, and C5-C6. 3. No evidence of high grade spinal canal stenosis.  COGNITION: Overall cognitive status: Impaired and pt noting more issues with memory - requiring notes where she used to be able to do presentations w/o notes   SENSATION: Constant tingling in L UE and LE  COORDINATION: Heel-toe WFL Heel to shin mildly impaired on L  POSTURE:  rounded shoulders, forward head, and flexed trunk   MUSCLE LENGTH: Hamstrings: mild tight R, mod tight L ITB: mild tight B Piriformis: mild tight B Hip flexors: mod tight R>L Quads: mild tight B Heelcord: mild/mod tight L>R  LOWER EXTREMITY ROM:    Grossly WFL except L ankle DF Active  Right eval Left eval  Ankle dorsiflexion 18 8  Ankle plantarflexion 48 46  Ankle inversion    Ankle eversion     (Blank rows = not tested)  LOWER EXTREMITY MMT:    MMT Right eval Left eval  Hip flexion 4 4  Hip extension 4 4  Hip abduction 3+ 3+  Hip adduction 4 4  Hip internal rotation 4+ 4+  Hip external rotation 4- 4-  Knee flexion 5 5  Knee extension 5 5  Ankle dorsiflexion 5 4+  Ankle plantarflexion    Ankle inversion    Ankle eversion    (Blank rows = not tested)  BED MOBILITY:  Independent but pt notes more difficulty than she used to have with rolling over  TRANSFERS: Assistive  device utilized: None  Sit to stand:  Complete Independence Stand to sit: Complete Independence Chair to chair: Complete Independence Floor:  NT  GAIT: Distance walked: clinic distances Assistive device utilized: None Level of assistance: Complete Independence Gait pattern: decreased arm swing- Right and decreased arm swing- Left Comments:   FUNCTIONAL TESTS:  5 times sit to stand: 11.96 sec Timed up and go (TUG): Normal = 5.82 sec; Manual = 6.22 sec; Cognitive = 5.59 sec  10 meter walk test: 8.78 sec; Gait speed = 3.74 ft/sec  Functional gait assessment: 26/30, 25-28 indicates a low risk fall (12/22/23)    PATIENT SURVEYS:  ABC scale 1540 / 1600 = 96.3 %   TODAY'S TREATMENT:   01/27/2024 THERAPEUTIC EXERCISE: To improve strength, endurance, ROM, and flexibility.  Demonstration, verbal and tactile cues throughout for technique. Rec Bike L4 x 6 min B Negative heel on step 2 x 30"  Alternating unilateral negative heel on step x 30" B Seated Assisted Cervical rotation w/ towel x 5 x 3" hold B  NEUROMUSCULAR RE-EDUCATION: To improve balance, coordination, kinesthesia, posture, proprioception, and speed of movement to reduce bradykinesia. "V" Wall Slides + arm lift x 10 "V" Wall Slides + arm lift x 10 w/ YTB "V" Wall Slides + arm lift w/ YTB x 10 Wall clock reaches w/ YTB x 6 - stopped due to L shoulder pain  PWR! Moves in standing:  Up x 10 Rock x 10 Twist x 10 Step x 10  PWR! Moves in supine: Up x 10 Rock x 10 - cued to keep one arm at side when rocking side to side Twist x 10 Step x 10 - cued to lift hips when moving from side to side  01/22/2024 THERAPEUTIC EXERCISE: To improve strength, endurance, ROM, and flexibility.  Demonstration, verbal and tactile cues throughout for technique. Rec Bike L4 x 6 min Doorway pec stretch @ 60 x 30" Thoracic extension x 30" - L arm was stiffer, only felt stretch in R side of chest and arm L single arm pec stretch in doorway x  30" "V" Wall Slides x 5 - felt soreness/pain in L upper arm "V" Wall Slides + arm lift x 5 felt soreness/pain in L upper arm Standing scap retraction + shoulder row w/ RTB 2 x 10 Standing scap retraction + shoulder ext w/ RTB 2 x 10 Standing scap retraction + ER w/ RTB 2 x 10  NEUROMUSCULAR RE-EDUCATION: To improve balance, coordination, kinesthesia, posture, proprioception, amplitude of movement, speed of movement to reduce bradykinesia, and reduce rigidity. PWR! Moves in sitting:  Up x 10 Rock x 10 Twist x 10 Step x 10  PWR! Moves in standing:  Up x 10 Rock x 10 Twist x 10 Step x 10  01/13/2024 THERAPEUTIC EXERCISE: To improve strength, endurance, ROM, and flexibility.  Demonstration, verbal and tactile cues throughout for technique. Rec Bike L4 x 6 min Doorway pec stretch @ 90 - didn't feel stretch; felt stuck Doorway pec stretch @ 60 - was able to feel stretch Bridge + ABD w/ GTB x 10 x 3-5" Side-lying clamshells w/ GTB x 10 B Seated knee marches w/ GTB x 10 B - felt B anteromedial knee pain Lateral side bank walks 2 x 10 - don't feel plantar fasciitis   MANUAL THERAPY: To promote normalized muscle tension, improved flexibility, and reduced pain utilizing therapeutic massage. STM to L anterior deltoid   SELF CARE: Provided education to reduce fall risk and to promote safe home environment. Reviewed the "Check for Safety - Home  Fall Prevention Checklist for Older Adults" to help identify fall risk hazards in the home along with strategies to reduce fall risk at home  PATIENT EDUCATION:  Education details: HEP update, continue with current HEP, and overview of PWR! Moves   Person educated: Patient Education method: Explanation, Demonstration, Verbal cues, and Handouts Education comprehension: verbalized understanding, returned demonstration, verbal cues required, and needs further education  HOME EXERCISE PROGRAM: Access Code: [email or web address] URL:  [email or web address] Date: 01/27/2024 Prepared by: Anahi Belmar Joseph-Greene  Exercises - Seated Hamstring Stretch  - 2 x daily - 7 x weekly - 3 reps - 30 sec hold - Standing Hip Flexor Stretch  - 2 x daily - 7 x weekly - 3 reps - 30 sec hold - Supine Lower Trunk Rotation  - 2 x daily - 7 x weekly - 5 reps - 10 sec hold - Sidelying Thoracic Rotation with Open Book  - 2 x daily - 7 x weekly - 10 reps - 5 sec hold - Clamshell with Resistance  - 1 x daily - 7 x weekly - 2 sets - 10 reps - 3-5 hold - Supine Bridge with Mini Swiss Ball Between Knees  - 1 x daily - 7 x weekly - 2 sets - 10 reps - 3-5 hold - Seated Figure 4 Piriformis Stretch  - 1 x daily - 7 x weekly - 2 sets - 5 reps - 30 hold - Doorway Pec Stretch at 60 Elevation  - 2 x daily - 7 x weekly - 2 sets - 30 sec hold - Side Stepping with Resistance at Thighs  - 1 x daily - 5 x weekly - 2 sets - 10 reps - Bridge with Hip Abduction and Resistance  - 1 x daily - 5 x weekly - 2 sets - 10 reps - Shoulder External Rotation and Scapular Retraction with Resistance  - 1 x daily - 5 x weekly - 2 sets - 10 reps - Shoulder extension with resistance - Neutral  - 1 x daily - 5 x weekly - 2 sets - 10 reps - Standing Shoulder Row with Anchored Resistance  - 1 x daily - 5 x weekly - 2 sets - 10 reps - Seated Assisted Cervical Rotation with Towel  - 1 x daily - 7 x weekly - 2 sets - 10 reps - 3 hold  Patient Education - Check for Safety  PWR! Moves: - Seated - Standing  - Supine   ASSESSMENT:  CLINICAL IMPRESSION: Ettel reports no issues or pain in L foot plantar fasciitis, but continues to report L shoulder pain and now R medial knee pain that occurs when she is performing squats. The R medial knee pain started last week because she was doing the PWR! Moves Up in standing exercise that involves squatting . Therefore, we started the session off with hamstring and gastroc stretches to alleviate muscle tension around the knee. She also  reported issues with L cervical rotation, mentioning that it's hard for her to drive because she is limited in L cervical rotation. To help with that, prescribed her seated assisted cervical rotation with towel which she tolerated well and stated that it felt good so we added that to her HEP. Continued working on Public affairs consultant and flexibility exercises to help prevent forward posture and alleviate L shoulder pain. Added resistance bands to wall slides which she preferred because she felt it more with them than without the bands. Repeated PWR! Moves in standing to ensure Jahla was performing  exercises correctly to safely perform at home for which she provided returned demonstration. In addition to the PWR! Moves in standing, we added the PWR! Moves in supine to help with bed mobility which she tolerated well without increase in R medial knee pain. She tolerated exercises well. Shmeka will benefit from continued skilled PT to address ongoing deficits to improve mobility, activity tolerance and maximum functional independence while incorporating more PWR! Moves to HEP to delay progression of Parkinson's symptoms.  OBJECTIVE IMPAIRMENTS: Abnormal gait, decreased activity tolerance, decreased coordination, difficulty walking, decreased strength, impaired perceived functional ability, impaired flexibility, impaired sensation, improper body mechanics, postural dysfunction, and pain.   ACTIVITY LIMITATIONS: sleeping, stairs, transfers, bed mobility, and locomotion level  PARTICIPATION LIMITATIONS: community activity, occupation, and travel  PERSONAL FACTORS: Past/current experiences, Time since onset of injury/illness/exacerbation, and 3+ comorbidities: Thoracic schwannoma s/p resection 2005 (Dr. Lamon Pillow), secondary hypothyroidism s/p thyroidectomy for tumor, chronic back pain, cervical and lumbar DDD, L LE N/T mid-thigh down into the lower leg and foot starting ~06/2023, constant numbness of the left 4th  toe since back injury at the age of 58, L plantar fasciitis, L sided weakness, L hand tremor  are also affecting patient's functional outcome.   REHAB POTENTIAL: Excellent  CLINICAL DECISION MAKING: Evolving/moderate complexity  EVALUATION COMPLEXITY: Moderate   GOALS: Goals reviewed with patient? Yes  SHORT TERM GOALS: Target date: 01/13/2024  Patient will be independent with initial HEP. Baseline:  Goal status: MET 01/13/24  2.  Patient will be educated on strategies to decrease risk of falls.  Baseline:  Goal status: MET 01/13/24   LONG TERM GOALS: Target date: 02/10/2024  Patient will be independent with ongoing/advanced HEP for self-management at home incorporating PWR! Moves as indicated.  Baseline:  Goal status: IN PROGRESS  2.  Patient will be able to ambulate 600' w/o AD over variable surfaces with good safety to access community.  Baseline:  Goal status: IN PROGRESS  3.  Patient will be able to ascend/descend stairs safely with or w/o single HR for safety with household and community ambulation.  Baseline:  Goal status: IN PROGRESS   4.  Patient will demonstrate gait speed of >/= 4.37 ft/sec (1.33 m/s) to demonstrate normal walking speed.  Baseline: 3.74 ft/sec Goal status: IN PROGRESS  5.  Patient will demonstrate at least 4 point improvement on FGA to improve gait stability and reduce risk for falls. (MCID = 4 points). Baseline: 26/30 (12/22/23) Goal status: IN PROGRESS  6.  Patient will verbalize understanding of local Parkinson's disease community resources, including community fitness post d/c. Baseline:  Goal status: IN PROGRESS   PLAN:  PT FREQUENCY: 2x/week  PT DURATION: 8 weeks  PLANNED INTERVENTIONS: [id number]- PT Re-evaluation, 97110-Therapeutic exercises, 97530- Therapeutic activity, [id number]- Neuromuscular re-education, 97535- Self Care, [id number]- Manual therapy, [id number]- Gait training, [phone]- Ultrasound, [id number]- Ionotophoresis 4mg /ml Dexamethasone ,  Patient/Family education, Balance training, Stair training, Taping, Dry Needling, Cryotherapy, Moist heat, and 97750- Physical Performance Test or Measurement  PLAN FOR NEXT SESSION: Assess LTGs; Recert or possible transition to HEP; Progress proximal LE/core flexibility and hip strengthening; progress PWR! Moves as tolerated; progress scapular/posture training/flexibility   Lainy Wrobleski Joseph-Greene, Student-PT 01/27/2024, 11:58 AM

## 2024-02-03 ENCOUNTER — Ambulatory Visit: Admitting: Physical Therapy

## 2024-02-05 ENCOUNTER — Ambulatory Visit: Attending: Neurology | Admitting: Physical Therapy

## 2024-02-05 ENCOUNTER — Encounter: Payer: Self-pay | Admitting: Physical Therapy

## 2024-02-05 DIAGNOSIS — R258 Other abnormal involuntary movements: Secondary | ICD-10-CM | POA: Insufficient documentation

## 2024-02-05 DIAGNOSIS — R252 Cramp and spasm: Secondary | ICD-10-CM | POA: Diagnosis present

## 2024-02-05 DIAGNOSIS — M6281 Muscle weakness (generalized): Secondary | ICD-10-CM | POA: Diagnosis present

## 2024-02-05 DIAGNOSIS — R2689 Other abnormalities of gait and mobility: Secondary | ICD-10-CM | POA: Diagnosis present

## 2024-02-05 DIAGNOSIS — M79672 Pain in left foot: Secondary | ICD-10-CM | POA: Insufficient documentation

## 2024-02-05 NOTE — Therapy (Addendum)
 OUTPATIENT PHYSICAL THERAPY TREATMENT / DISCHARGE SUMMARY  Progress Note  Reporting Period 12/16/2023 to 02/05/2024   See note below for Objective Data and Assessment of Progress/Goals.     Patient Name: Kristine Richmond MRN: [id number] DOB:16-Aug-1956, 68 y.o., female Today's Date: 02/05/2024   END OF SESSION:  PT End of Session - 02/05/24 1318     Visit Number 7    Date for PT Re-Evaluation 02/10/24    Authorization Type [employer]    PT Start Time 1317    PT Stop Time 1402    PT Time Calculation (min) 45 min    Activity Tolerance Patient tolerated treatment well    Behavior During Therapy WFL for tasks assessed/performed                   Past Medical History:  Diagnosis Date   DDD (degenerative disc disease), cervical    DDD (degenerative disc disease), lumbar    L4-L5   Hypothyroidism    thyorid removed   Tumor    back   Past Surgical History:  Procedure Laterality Date   DILATION AND CURETTAGE OF UTERUS     3   THYROIDECTOMY     tumor removed Left    back   Patient Active Problem List   Diagnosis Date Noted   Chronic back pain     PCP:  No PCP  REFERRING PROVIDER: Patel, Donika K, DO   REFERRING DIAG: G20.A1 (ICD-10-CM) - Parkinson's disease without dyskinesia or fluctuating manifestations (HCC)   THERAPY DIAG:  Other abnormalities of gait and mobility  Other abnormal involuntary movements  Muscle weakness (generalized)  Pain in left foot  Cramp and spasm  RATIONALE FOR EVALUATION AND TREATMENT: Rehabilitation  ONSET DATE: Parkinson's diagnosis as of 11/26/2023; symptoms first noticed ~2 yrs ago  NEXT MD VISIT: 03/15/24   SUBJECTIVE:                                                                                                                                                                                                         SUBJECTIVE STATEMENT: Feeling good, the best she's been in 2 years. Feels twinges in B knees when bending.  Pt  accompanied by: self  PAIN: Are you having pain? Yes: NPRS scale: 1/10 when walking  Pain location: L heel/plantar fascia Pain description: stabbing  Aggravating factors: walking  Relieving factors: rest   PERTINENT HISTORY:  Thoracic schwannoma s/p resection 2005 (Dr. Onetha), secondary hypothyroidism s/p thyroidectomy for tumor, chronic back pain, cervical and lumbar DDD, L LE N/T  mid-thigh down into the lower leg and foot starting ~06/2023, constant numbness of the left 4th toe since back injury at the age of 85, L plantar fasciitis, L sided weakness, L hand tremor  PRECAUTIONS: None  RED FLAGS: None  WEIGHT BEARING RESTRICTIONS: No  FALLS:  Has patient fallen in last 6 months? No  LIVING ENVIRONMENT: Lives with: lives with their spouse Lives in: House/apartment Stairs: Yes: Internal: 14 steps; on right going up and External: 2 or 3 steps; none and R rail going up 3 front steps, no rail in garage (2 steps) Has following equipment at home: None  OCCUPATION: FT - mostly deskwork (change management), occasional travel  PLOF: Independent and Leisure: painting, drawing, sewing; sit to stand for exercises, rowing machine (not typically used), stationary bike w/o tension  PATIENT GOALS: To be able to walk normal.   OBJECTIVE: (objective measures completed at initial evaluation unless otherwise dated)  DIAGNOSTIC FINDINGS:  11/21/23 - NM Brain DATSCAN  FINDINGS: Absent radiotracer activity LEFT and RIGHT putamen. Decreased radiotracer activity in head of the RIGHT caudate nucleus compared to the LEFT.   IMPRESSION: Bilateral decreased striatal Ioflupane activity as above. This pattern can be seen in Parkinsonian syndromes.   Of note, DaTSCAN  is not diagnostic of Parkinsonian syndromes, which remains a clinical diagnosis. DaTscan  is an adjuvant test to aid in the clinical diagnosis of Parkinsonian syndromes.  10/27/23 - MR Brain IMPRESSION: Essentially normal brain MRI with no  acute intracranial pathology or other finding to explain the patient's symptoms.  09/01/23 - MR Lumbar spine IMPRESSION: 1. Generally very mild for age lumbar spine degeneration, and capacious lumbar spinal canal. No spinal stenosis or convincing neural impingement. 2. Chronic L5-S1 disc and endplate degeneration, with mild left L5 neural foraminal stenosis, does not appear significantly changed since 2012.  08/01/23 - MR Thoracic spine IMPRESSION: 1. Normal MRI of the thoracic cord. 2. 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 This herniation has been present since at least 2012.  08/01/23 - MR Cervical spine IMPRESSION: 1. No contrast-enhancing lesion is visualized. 2. Moderate left sided neuroforaminal narrowing at C3-C4, C4-C5, and C5-C6. 3. No evidence of high grade spinal canal stenosis.  COGNITION: Overall cognitive status: Impaired and pt noting more issues with memory - requiring notes where she used to be able to do presentations w/o notes   SENSATION: Constant tingling in L UE and LE  COORDINATION: Heel-toe WFL Heel to shin mildly impaired on L  POSTURE:  rounded shoulders, forward head, and flexed trunk   MUSCLE LENGTH: Hamstrings: mild tight R, mod tight L ITB: mild tight B Piriformis: mild tight B Hip flexors: mod tight R>L Quads: mild tight B Heelcord: mild/mod tight L>R  LOWER EXTREMITY ROM:    Grossly WFL except L ankle DF Active  Right eval Left eval  Ankle dorsiflexion 18 8  Ankle plantarflexion 48 46  Ankle inversion    Ankle eversion     (Blank rows = not tested)  LOWER EXTREMITY MMT:    MMT Right eval Left eval  Hip flexion 4 4  Hip extension 4 4  Hip abduction 3+ 3+  Hip adduction 4 4  Hip internal rotation 4+ 4+  Hip external rotation 4- 4-  Knee flexion 5 5  Knee extension 5 5  Ankle dorsiflexion 5 4+  Ankle plantarflexion    Ankle inversion    Ankle eversion    (Blank rows = not  tested)  BED  MOBILITY:  Independent but pt notes more difficulty than she used to have with rolling over  TRANSFERS: Assistive device utilized: None  Sit to stand: Complete Independence Stand to sit: Complete Independence Chair to chair: Complete Independence Floor: NT  GAIT: Distance walked: clinic distances Assistive device utilized: None Level of assistance: Complete Independence Gait pattern: decreased arm swing- Right and decreased arm swing- Left Comments:   FUNCTIONAL TESTS:  5 times sit to stand: 11.96 sec Timed up and go (TUG): Normal = 5.82 sec; Manual = 6.22 sec; Cognitive = 5.59 sec  10 meter walk test: 8.78 sec; Gait speed = 3.74 ft/sec  Functional gait assessment: 26/30, 25-28 indicates a low risk fall (12/22/23)    PATIENT SURVEYS:  ABC scale 1540 / 1600 = 96.3 %   TODAY'S TREATMENT:   02/05/2024 THERAPEUTIC EXERCISE: To improve strength, endurance, ROM, and flexibility.  Demonstration, verbal and tactile cues throughout for technique. Rec Bike L4 x 6 min  PHYSICAL PERFORMANCE TEST or MEASUREMENT: FGA = 29/30, no increased fall risk identified  Functional Gait  Assessment  Gait Level Surface Walks 20 ft in less than 5.5 sec, no assistive devices, good speed, no evidence for imbalance, normal gait pattern, deviates no more than 6 in outside of the 12 in walkway width.   Change in Gait Speed Able to smoothly change walking speed without loss of balance or gait deviation. Deviate no more than 6 in outside of the 12 in walkway width.   Gait with Horizontal Head Turns Performs head turns smoothly with no change in gait. Deviates no more than 6 in outside 12 in walkway width   Gait with Vertical Head Turns Performs head turns with no change in gait. Deviates no more than 6 in outside 12 in walkway width.   Gait and Pivot Turn Pivot turns safely within 3 sec and stops quickly with no loss of balance.   Step Over Obstacle Is able to step over 2 stacked shoe boxes taped  together (9 in total height) without changing gait speed. No evidence of imbalance.   Gait with Narrow Base of Support Ambulates 7-9 steps.   Gait with Eyes Closed Walks 20 ft, no assistive devices, good speed, no evidence of imbalance, normal gait pattern, deviates no more than 6 in outside 12 in walkway width. Ambulates 20 ft in less than 7 sec.   Ambulating Backwards Walks 20 ft, no assistive devices, good speed, no evidence for imbalance, normal gait   Steps Alternating feet, no rail.   Total Score 29       NEUROMUSCULAR RE-EDUCATION: To improve balance, coordination, kinesthesia, posture, proprioception, and speed of movement to reduce bradykinesia. PWR! Moves in prone:  Up x 10 Rock x 10 Twist x 10 Step x 10  PWR! Moves in quadruped:  Up x 10 Rock x 10 Twist x 10 Step x 10 - stopped halfway through when B wrists started to bother her; given instructions on modifying exercises with UE support on chair    01/27/2024 THERAPEUTIC EXERCISE: To improve strength, endurance, ROM, and flexibility.  Demonstration, verbal and tactile cues throughout for technique. Rec Bike L4 x 6 min B Negative heel on step 2 x 30  Alternating unilateral negative heel on step x 30 B Seated Assisted Cervical rotation w/ towel x 5 x 3 hold B  NEUROMUSCULAR RE-EDUCATION: To improve balance, coordination, kinesthesia, posture, proprioception, and speed of movement to reduce bradykinesia. V Wall Slides + arm lift x 10 V Wall Slides +  arm lift x 10 w/ YTB V Wall Slides + arm lift w/ YTB x 10 Wall clock reaches w/ YTB x 6 - stopped due to L shoulder pain  PWR! Moves in standing:  Up x 10 Rock x 10 Twist x 10 Step x 10  PWR! Moves in supine: Up x 10 Rock x 10 - cued to keep one arm at side when rocking side to side Twist x 10 Step x 10 - cued to lift hips when moving from side to side   01/22/2024 THERAPEUTIC EXERCISE: To improve strength, endurance, ROM, and flexibility.  Demonstration,  verbal and tactile cues throughout for technique. Rec Bike L4 x 6 min Doorway pec stretch @ 60 x 30 Thoracic extension x 30 - L arm was stiffer, only felt stretch in R side of chest and arm L single arm pec stretch in doorway x 30 V Wall Slides x 5 - felt soreness/pain in L upper arm V Wall Slides + arm lift x 5 felt soreness/pain in L upper arm Standing scap retraction + shoulder row w/ RTB 2 x 10 Standing scap retraction + shoulder ext w/ RTB 2 x 10 Standing scap retraction + ER w/ RTB 2 x 10  NEUROMUSCULAR RE-EDUCATION: To improve balance, coordination, kinesthesia, posture, proprioception, amplitude of movement, speed of movement to reduce bradykinesia, and reduce rigidity. PWR! Moves in sitting:  Up x 10 Rock x 10 Twist x 10 Step x 10  PWR! Moves in standing:  Up x 10 Rock x 10 Twist x 10 Step x 10  PATIENT EDUCATION:  Education details: HEP review, HEP progression, continue with current HEP, PWR! Moves - prone & quadruped, and overview of community PD resources and exercise groups  Person educated: Patient Education method: Explanation, Demonstration, Verbal cues, and Handouts Education comprehension: verbalized understanding, returned demonstration, and verbal cues required  HOME EXERCISE PROGRAM: Access Code: [email or web address] URL: [email or web address] Date: 01/27/2024 Prepared by: Christiano Blandon Joseph-Greene  Exercises - Seated Hamstring Stretch  - 2 x daily - 7 x weekly - 3 reps - 30 sec hold - Standing Hip Flexor Stretch  - 2 x daily - 7 x weekly - 3 reps - 30 sec hold - Supine Lower Trunk Rotation  - 2 x daily - 7 x weekly - 5 reps - 10 sec hold - Sidelying Thoracic Rotation with Open Book  - 2 x daily - 7 x weekly - 10 reps - 5 sec hold - Clamshell with Resistance  - 1 x daily - 7 x weekly - 2 sets - 10 reps - 3-5 hold - Supine Bridge with Mini Swiss Ball Between Knees  - 1 x daily - 7 x weekly - 2 sets - 10 reps - 3-5 hold - Seated Figure 4 Piriformis  Stretch  - 1 x daily - 7 x weekly - 2 sets - 5 reps - 30 hold - Doorway Pec Stretch at 60 Elevation  - 2 x daily - 7 x weekly - 2 sets - 30 sec hold - Side Stepping with Resistance at Thighs  - 1 x daily - 5 x weekly - 2 sets - 10 reps - Bridge with Hip Abduction and Resistance  - 1 x daily - 5 x weekly - 2 sets - 10 reps - Shoulder External Rotation and Scapular Retraction with Resistance  - 1 x daily - 5 x weekly - 2 sets - 10 reps - Shoulder extension with resistance - Neutral  - 1 x daily - 5 x  weekly - 2 sets - 10 reps - Standing Shoulder Row with Anchored Resistance  - 1 x daily - 5 x weekly - 2 sets - 10 reps - Seated Assisted Cervical Rotation with Towel  - 1 x daily - 7 x weekly - 2 sets - 10 reps - 3 hold  Patient Education - Check for Safety  PWR! Moves: - Seated - Standing  - Supine - Prone - Quadruped   ASSESSMENT:  CLINICAL IMPRESSION: Today we focused on address LTGs as she was nearing the end of her POC. She improved her FGA score by 3 points, demonstrated a gait speed of 4.48 ft/sec, indicative of a normal walking speed, and was able to ascend/descend stairs without using AD or HR. Although she was able to ascend/descend the stairs safely, I did have to inform her about putting her whole foot on the step because she only putting half of her foot on the step, which is due to her compensating over the years due to the plantar fascia. After the testing, we focused on incorporating PWR! Moves in prone and in quadruped. She returned demonstration with the PWR! Moves in prone and quadruped. With the quadruped, we had to modify to UE support on chair rather than floor because her wrists began hurting after being in that position for a while. All PT goals now met and Dianelys feels ready to transition to her HEP, but would like to remain on hold for 30 days in the event issues arise that would necessitate a return to PT.  OBJECTIVE IMPAIRMENTS: Abnormal gait, decreased activity  tolerance, decreased coordination, difficulty walking, decreased strength, impaired perceived functional ability, impaired flexibility, impaired sensation, improper body mechanics, postural dysfunction, and pain.   ACTIVITY LIMITATIONS: sleeping, stairs, transfers, bed mobility, and locomotion level  PARTICIPATION LIMITATIONS: community activity, occupation, and travel  PERSONAL FACTORS: Past/current experiences, Time since onset of injury/illness/exacerbation, and 3+ comorbidities: Thoracic schwannoma s/p resection 2005 (Dr. Onetha), secondary hypothyroidism s/p thyroidectomy for tumor, chronic back pain, cervical and lumbar DDD, L LE N/T mid-thigh down into the lower leg and foot starting ~06/2023, constant numbness of the left 4th toe since back injury at the age of 32, L plantar fasciitis, L sided weakness, L hand tremor are also affecting patient's functional outcome.   REHAB POTENTIAL: Excellent  CLINICAL DECISION MAKING: Evolving/moderate complexity  EVALUATION COMPLEXITY: Moderate   GOALS: Goals reviewed with patient? Yes  SHORT TERM GOALS: Target date: 01/13/2024  Patient will be independent with initial HEP. Baseline:  Goal status: MET 01/13/24  2.  Patient will be educated on strategies to decrease risk of falls.  Baseline:  Goal status: MET 01/13/24   LONG TERM GOALS: Target date: 02/10/2024  Patient will be independent with ongoing/advanced HEP for self-management at home incorporating PWR! Moves as indicated.  Baseline:  Goal status: MET - 02/05/24  2.  Patient will be able to ambulate 600' w/o AD over variable surfaces with good safety to access community.  Baseline:  Goal status: MET - 02/05/24  3.  Patient will be able to ascend/descend stairs safely with or w/o single HR for safety with household and community ambulation.  Baseline:  Goal status: MET - 02/05/24   4.  Patient will demonstrate gait speed of >/= 4.37 ft/sec (1.33 m/s) to demonstrate normal walking speed.   Baseline: 3.74 ft/sec Goal status: MET - 02/05/24 4.30ft/sec  5.  Patient will demonstrate at least 4 point improvement on FGA to improve gait stability and reduce risk  for falls. (MCID = 4 points). Baseline: 26/30 (12/22/23) Goal status: MET - 02/05/24 29/30  6.  Patient will verbalize understanding of local Parkinson's disease community resources, including community fitness post d/c. Baseline:  Goal status: MET - 02/05/24   PLAN:  PT FREQUENCY: 2x/week  PT DURATION: 8 weeks  PLANNED INTERVENTIONS: 02835- PT Re-evaluation, 97110-Therapeutic exercises, 97530- Therapeutic activity, 97112- Neuromuscular re-education, 97535- Self Care, 02859- Manual therapy, [phone]- Gait training, [phone]- Ultrasound, [phone]- Ionotophoresis 4mg /ml Dexamethasone , Patient/Family education, Balance training, Stair training, Taping, Dry Needling, Cryotherapy, Moist heat, and 97750- Physical Performance Test or Measurement  PLAN FOR NEXT SESSION: Transition to HEP + 30 day hold, call in ~5 months to schedule 66-month PD screen   Youssouf Shipley Joseph-Greene, Student-PT 02/05/2024, 3:13 PM   PHYSICAL THERAPY DISCHARGE SUMMARY  Visits from Start of Care: 7  Current functional level related to goals / functional outcomes: Refer to above clinical impression and goal assessment for status as of last visit on 02/05/2024. Patient was placed on hold for 30 days and has not needed to return to PT, therefore will proceed with discharge from PT for this episode.     Remaining deficits: As above.   Education / Equipment: HEP, PWR! Moves   Patient agrees to discharge. Patient goals were met. Patient is being discharged due to meeting the stated rehab goals.  Elijah EMERSON Hidden, PT 05/05/2024, 11:49 AM  [facility] [address]  [address] [place], [place], [address] Phone: [phone]   Fax:  [phone]

## 2024-02-10 ENCOUNTER — Encounter: Admitting: Physical Therapy

## 2024-03-15 ENCOUNTER — Ambulatory Visit (INDEPENDENT_AMBULATORY_CARE_PROVIDER_SITE_OTHER): Payer: 59 | Admitting: Neurology

## 2024-03-15 ENCOUNTER — Encounter: Payer: Self-pay | Admitting: Neurology

## 2024-03-15 VITALS — BP 136/93 | HR 95 | Ht 67.0 in | Wt 235.0 lb

## 2024-03-15 DIAGNOSIS — R202 Paresthesia of skin: Secondary | ICD-10-CM

## 2024-03-15 DIAGNOSIS — R200 Anesthesia of skin: Secondary | ICD-10-CM | POA: Diagnosis not present

## 2024-03-15 DIAGNOSIS — G20A1 Parkinson's disease without dyskinesia, without mention of fluctuations: Secondary | ICD-10-CM

## 2024-03-15 NOTE — Progress Notes (Signed)
 Follow-up Visit   Date: 03/15/2024    Kristine Richmond MRN: [id number] DOB: 1956/04/07    Kristine Richmond is a 68 y.o. right-handed Caucasian female with history of thoracic schwannoma s/p resection (Dr. Lamon Pillow), secondary hypothyroidism s/p thyroidectomy for tumor returning to the clinic for follow-up of left hand tremor and leg tingling.  The patient was accompanied to the clinic by self.   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  She was started on sinemet  in February and her exam shows improved rigidity in the left arm as well as gait.  - Continue carbidopa -levadopa 25/100 1 tablet three times daily  - Continue home PT exercises  2.  Intermittent left leg paresthesias, possibly due to L5 radiculopathy  - Restart gabapentin  300mg  at bedtime if symptoms get wores  3.  Right shoulder pain  - Follow-up with sports medicine/orthopeadics for right shoulder pain  Return to clinic in 4 months  --------------------------------------------- History of present illness: Starting in January 2024, she began having left foot plantar fasciitis for which she was doing PT.  During her PT, she began having left mid-thigh down into the lower leg and foot.   She also has weakness in the left foot with extension.  No falls in the past year.  She walks unassisted.  Her physical therapist noticed that she has left ankle clonus and reduced ROM with dorsiflexion, so recommended that she consider seeing neurology.  She also has left hand tremor which is new and feels that her strength and movements are lower on the left.  Her mother also has tremors of both hands.     She has constant numbness of the left 4th toe since back injury at the age of 89.  She also had history of thoracic schwannoma which was removed by Dr. Lamon Pillow in 2005.    UPDATE 11/11/2023:  She is here for follow-up to discuss  imaging results, which did not show evidence of structural pathology.  Since she was last here, she feels that her feet are shuffling more when walking.  Movements in the left hand and leg requires more concentration.  Her movements are slower and she also feels that cognitively, its takes more effort to do tasks.  Her hand writing has becomes smaller and not as neat.  She still has difficulty with extending the left foot.  When her left arm is placed in certain positions such as hyperextension at the wrist, she has noticed involuntary tremor, it resolved with repositioning.   UPDATE 11/26/2023:  She is here to discuss results of DAT scan which was positive.  There has been no significant change in her symptoms.  She continues to have slower movements on the left and gait is much more effortful.   UPDATE 03/15/2024:  She is here for follow-up visit.  She has been compliant with taking sinemet  and feels that it has helped some.  Her plantar fasciitis in the left foot has significantly improved and thus, her walking is better.  She continues to have intermittent tingling in the left leg, which is annoying, but denies pain. She completed PT, but developed right shoulder pain which is worse with arm extension. No weakness in the arms or legs.    Medications:  Current Outpatient Medications on File Prior to Visit  Medication Sig Dispense Refill   albuterol (VENTOLIN HFA) 108 (90 Base) MCG/ACT inhaler TAKE 2 PUFF(S) INHALED EVERY  6 HOURS, AS NEEDED     Bromfenac Sodium 0.09 % SOLN  (Patient not taking: Reported on 12/16/2023)     carbidopa -levodopa  (SINEMET  IR) 25-100 MG tablet Take 1 tablet by mouth 3 (three) times daily. 90 tablet 5   fluticasone (FLONASE) 50 MCG/ACT nasal spray 2 sprays every morning for 30 days.     gabapentin  (NEURONTIN ) 300 MG capsule Take 2 capsules (600 mg total) by mouth 2 (two) times daily. (Patient not taking: Reported on 12/16/2023) 120 capsule 3   levothyroxine (SYNTHROID) 112 MCG  tablet Take one tab po daily except take an additional 1/2 tab on mondays     liothyronine (CYTOMEL) 5 MCG tablet Take 1 tablet by mouth daily.     magnesium 30 MG tablet Take 30 mg by mouth 2 (two) times daily.     Turmeric (QC TUMERIC COMPLEX PO) Take by mouth. Take two tablets at bedtime     Vitamin D, Ergocalciferol, (DRISDOL) 1.25 MG (50000 UNIT) CAPS capsule Take 50,000 Units by mouth every 7 (seven) days.     No current facility-administered medications on file prior to visit.    Allergies:  Allergies  Allergen Reactions   Amoxicillin-Pot Clavulanate Other (See Comments)   Morphine Nausea Only and Other (See Comments)   Moxifloxacin Other (See Comments) and Nausea Only    Dizziness  Dizziness  Dizziness    Vital Signs:  BP (!) 136/93   Pulse 95   Ht 5' 7 (1.702 m)   Wt 235 lb (106.6 kg)   SpO2 96%   BMI 36.81 kg/m   Neurological Exam: MENTAL STATUS including orientation to time, place, person, recent and remote memory, attention span and concentration, language, and fund of knowledge is normal.  Speech is not dysarthric.  CRANIAL NERVES:  Pupils equal round and reactive to light.  Normal conjugate, extra-ocular eye movements in all directions of gaze.  No ptosis.  Face is symmetric.   MOTOR:  Motor strength is 5/5 in all extremities.  Trance hand tremor bilaterally (improved). Normal tone in the arms (improved).  Left leg tone is still increased.  Tone on the right leg is normal.  MSRs:                                           Right        Left brachioradialis 2+  2+  biceps 2+  2+  triceps 2+  2+  patellar 3+  3+  ankle jerk 2+  2+   COORDINATION/GAIT:  Normal finger-to- nose-finger.  Finger tapping is improved on the left and symmetric.  Toe tapping on the left is still slowed.  Gait showed mildly reduced on the left, overall improved and steps are no longer shuffling, she has good stride.  Data: MRI cervical spine wo contrast 08/12/2023: 1. No  contrast-enhancing lesion is visualized. 2. Moderate left sided neuroforaminal narrowing at C3-C4, C4-C5, and C5-C6. 3. No evidence of high grade spinal canal stenosis.  MRI thoracic spine wo contrast 08/12/2023: 1. Normal MRI of the thoracic cord. 2. 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 This herniation has been present since at least 2012.   MRI lumbar spine wo contrast 09/14/2023: 1. Generally very mild for age lumbar spine degeneration, and capacious lumbar spinal canal. No spinal stenosis or convincing neural impingement. 2. Chronic L5-S1 disc  and endplate degeneration, with mild left L5 neural foraminal stenosis, does not appear significantly changed since 2012.  MRI brain wwo contrast 11/06/2023: Essentially normal brain MRI with no acute intracranial pathology or other finding to explain the patient's symptoms.   DAT scan 11/21/2023: Bilateral decreased striatal Ioflupane activity as above. This pattern can be seen in Parkinsonian syndromes.   Of note, DaTSCAN  is not diagnostic of Parkinsonian syndromes, which remains a clinical diagnosis. DaTscan  is an adjuvant test to aid in the clinical diagnosis of Parkinsonian syndromes.    Thank you for allowing me to participate in patient's care.  If I can answer any additional questions, I would be pleased to do so.    Sincerely,    Parley Pidcock K. Lydia Sams, DO

## 2024-03-25 ENCOUNTER — Other Ambulatory Visit: Payer: Self-pay

## 2024-03-25 MED ORDER — CARBIDOPA-LEVODOPA 25-100 MG PO TABS: ORAL_TABLET | ORAL | 1 refills | Status: DC

## 2024-08-09 ENCOUNTER — Encounter: Payer: Self-pay | Admitting: Neurology

## 2024-08-09 ENCOUNTER — Ambulatory Visit (INDEPENDENT_AMBULATORY_CARE_PROVIDER_SITE_OTHER): Admitting: Neurology

## 2024-08-09 VITALS — BP 119/79 | HR 98 | Ht 67.0 in | Wt 217.0 lb

## 2024-08-09 DIAGNOSIS — R202 Paresthesia of skin: Secondary | ICD-10-CM | POA: Diagnosis not present

## 2024-08-09 DIAGNOSIS — R200 Anesthesia of skin: Secondary | ICD-10-CM

## 2024-08-09 DIAGNOSIS — G20A1 Parkinson's disease without dyskinesia, without mention of fluctuations: Secondary | ICD-10-CM

## 2024-08-09 MED ORDER — CARBIDOPA-LEVODOPA 25-100 MG PO TABS: ORAL_TABLET | ORAL | 3 refills | Status: AC

## 2024-08-09 MED ORDER — GABAPENTIN 300 MG PO CAPS: ORAL_CAPSULE | ORAL | 5 refills | Status: AC

## 2024-08-09 NOTE — Progress Notes (Signed)
 Follow-up Visit   Date: 08/09/2024    Kristine Richmond MRN: [id number] DOB: December 11, 1955    Kristine Richmond is a 68 y.o. right-handed Caucasian female with history of thoracic schwannoma s/p resection (Dr. Onetha), secondary hypothyroidism s/p thyroidectomy for tumor returning to the clinic for follow-up of left hand tremor and leg tingling.  The patient was accompanied to the clinic by self.   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  Upper extremity rigidity has improved on sinemet , however, left leg remains rigid which is causing some gait difficulty.  She admits to sometimes missing sinemet  and typically takes it twice daily. - Encouraged compliance with carbidopa -levadopa 25/100 1 tab TID - Continue home exercises  2.  Left leg paresthesias, possibly due to L5 radiculopathy  - Start gabapentin  300mg  at bedtime x 1 week, then increase to 2 tablets at bedtime  - If symptoms get worse, NCS/EMG can be ordered  Return to clinic in 6 months  --------------------------------------------- History of present illness: Starting in January 2024, she began having left foot plantar fasciitis for which she was doing PT.  During her PT, she began having left mid-thigh down into the lower leg and foot.   She also has weakness in the left foot with extension.  No falls in the past year.  She walks unassisted.  Her physical therapist noticed that she has left ankle clonus and reduced ROM with dorsiflexion, so recommended that she consider seeing neurology.  She also has left hand tremor which is new and feels that her strength and movements are lower on the left.  Her mother also has tremors of both hands.     She has constant numbness of the left 4th toe since back injury at the age of 78.  She also had history of thoracic schwannoma which was removed by Dr. Onetha in 2005.    UPDATE  11/11/2023:  She is here for follow-up to discuss imaging results, which did not show evidence of structural pathology.  Since she was last here, she feels that her feet are shuffling more when walking.  Movements in the left hand and leg requires more concentration.  Her movements are slower and she also feels that cognitively, its takes more effort to do tasks.  Her hand writing has becomes smaller and not as neat.  She still has difficulty with extending the left foot.  When her left arm is placed in certain positions such as hyperextension at the wrist, she has noticed involuntary tremor, it resolved with repositioning.   UPDATE 11/26/2023:  She is here to discuss results of DAT scan which was positive.  There has been no significant change in her symptoms.  She continues to have slower movements on the left and gait is much more effortful.   UPDATE 03/15/2024:  She is here for follow-up visit.  She has been compliant with taking sinemet  and feels that it has helped some.  Her plantar fasciitis in the left foot has significantly improved and thus, her walking is better.  She continues to have intermittent tingling in the left leg, which is annoying, but denies pain. She completed PT, but developed right shoulder pain which is worse with arm extension. No weakness in the arms or legs.   UPDATE 08/09/2024:  She is here for follow-up visit.   Overall, she is stable.  There has no been any significant chance.  She  continues to notice that her leg leg does not always cooperate when walking and it takes more effort.  No falls or weakness.   She has completed which has helped.  She also has tingling in the left leg and stopped gabapentin  300mg  at bedtime.  Higher dose was not tried.    Medications:  Current Outpatient Medications on File Prior to Visit  Medication Sig Dispense Refill   albuterol (VENTOLIN HFA) 108 (90 Base) MCG/ACT inhaler TAKE 2 PUFF(S) INHALED EVERY 6 HOURS, AS NEEDED     carbidopa -levodopa   (SINEMET  IR) 25-100 MG tablet Take 1 tablet by mouth 3 (three) times daily. 270 tablet 1   fluticasone (FLONASE) 50 MCG/ACT nasal spray 2 sprays every morning for 30 days.     levothyroxine (SYNTHROID) 112 MCG tablet Take one tab po daily except take an additional 1/2 tab on mondays     liothyronine (CYTOMEL) 5 MCG tablet Take 1 tablet by mouth daily.     magnesium 30 MG tablet Take 30 mg by mouth 2 (two) times daily.     Turmeric (QC TUMERIC COMPLEX PO) Take by mouth. Take two tablets at bedtime     Vitamin D, Ergocalciferol, (DRISDOL) 1.25 MG (50000 UNIT) CAPS capsule Take 50,000 Units by mouth every 7 (seven) days.     No current facility-administered medications on file prior to visit.    Allergies:  Allergies  Allergen Reactions   Amoxicillin-Pot Clavulanate Other (See Comments)   Morphine Nausea Only and Other (See Comments)   Moxifloxacin Other (See Comments) and Nausea Only    Dizziness  Dizziness  Dizziness    Vital Signs:  BP 119/79   Pulse 98   Ht 5' 7 (1.702 m)   Wt 217 lb (98.4 kg)   SpO2 98%   BMI 33.99 kg/m   Neurological Exam: MENTAL STATUS including orientation to time, place, person, recent and remote memory, attention span and concentration, language, and fund of knowledge is normal.  Speech is not dysarthric.  CRANIAL NERVES:  Pupils equal round and reactive to light.  Normal conjugate, extra-ocular eye movements in all directions of gaze.  No ptosis.  Face is symmetric.   MOTOR:  Motor strength is 5/5 in all extremities.  Trance hand tremor bilaterally (stable). Normal tone in the arms (stable).  Left leg tone is increased.  Tone on the right leg is normal.  MSRs:                                           Right        Left brachioradialis 2+  2+  biceps 2+  2+  triceps 2+  2+  patellar 3+  3+  ankle jerk 2+  2+   COORDINATION/GAIT:  Normal finger-to- nose-finger.  Finger tapping is improved on the left and symmetric.  Toe tapping on the left is  slowed.  Gait showed mildly reduced arm swing on the left and mild ataxia with the left leg.  Unassisted and stable. Stride has improved.   overall improved and steps are no longer shuffling, she has good stride.  Data: MRI cervical spine wo contrast 08/12/2023: 1. No contrast-enhancing lesion is visualized. 2. Moderate left sided neuroforaminal narrowing at C3-C4, C4-C5, and C5-C6. 3. No evidence of high grade spinal canal stenosis.  MRI thoracic spine wo contrast 08/12/2023: 1. Normal MRI of the thoracic cord. 2.  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 This herniation has been present since at least 2012.   MRI lumbar spine wo contrast 09/14/2023: 1. Generally very mild for age lumbar spine degeneration, and capacious lumbar spinal canal. No spinal stenosis or convincing neural impingement. 2. Chronic L5-S1 disc and endplate degeneration, with mild left L5 neural foraminal stenosis, does not appear significantly changed since 2012.  MRI brain wwo contrast 11/06/2023: Essentially normal brain MRI with no acute intracranial pathology or other finding to explain the patient's symptoms.   DAT scan 11/21/2023: Bilateral decreased striatal Ioflupane activity as above. This pattern can be seen in Parkinsonian syndromes.   Of note, DaTSCAN  is not diagnostic of Parkinsonian syndromes, which remains a clinical diagnosis. DaTscan  is an adjuvant test to aid in the clinical diagnosis of Parkinsonian syndromes.    Thank you for allowing me to participate in patient's care.  If I can answer any additional questions, I would be pleased to do so.    Sincerely,    Jette Lewan K. Tobie, DO

## 2024-08-09 NOTE — Patient Instructions (Signed)
 Start gabapentin  300mg  at bedtime for one week, then increase to 2 tablets at bedtime  Continue sinemet  three times daily  Continue home exercises
